# Patient Record
Sex: Female | Born: 1935 | Race: White | Hispanic: No | Marital: Single | Smoking: Former smoker
Health system: Southern US, Community
[De-identification: ages and names within clinical notes are randomized; demographics above are authoritative.]

## PROBLEM LIST (undated history)

## (undated) DIAGNOSIS — G309 Alzheimer's disease, unspecified: Secondary | ICD-10-CM

## (undated) DIAGNOSIS — D72819 Decreased white blood cell count, unspecified: Secondary | ICD-10-CM

## (undated) DIAGNOSIS — D1739 Benign lipomatous neoplasm of skin and subcutaneous tissue of other sites: Secondary | ICD-10-CM

## (undated) DIAGNOSIS — F0280 Dementia in other diseases classified elsewhere without behavioral disturbance: Secondary | ICD-10-CM

## (undated) DIAGNOSIS — H3530 Unspecified macular degeneration: Secondary | ICD-10-CM

## (undated) DIAGNOSIS — K5900 Constipation, unspecified: Secondary | ICD-10-CM

## (undated) DIAGNOSIS — D649 Anemia, unspecified: Secondary | ICD-10-CM

## (undated) DIAGNOSIS — M6789 Other specified disorders of synovium and tendon, multiple sites: Secondary | ICD-10-CM

## (undated) DIAGNOSIS — F329 Major depressive disorder, single episode, unspecified: Secondary | ICD-10-CM

## (undated) DIAGNOSIS — I270 Primary pulmonary hypertension: Secondary | ICD-10-CM

## (undated) DIAGNOSIS — I10 Essential (primary) hypertension: Secondary | ICD-10-CM

## (undated) DIAGNOSIS — J189 Pneumonia, unspecified organism: Secondary | ICD-10-CM

## (undated) DIAGNOSIS — H109 Unspecified conjunctivitis: Secondary | ICD-10-CM

## (undated) DIAGNOSIS — I4891 Unspecified atrial fibrillation: Secondary | ICD-10-CM

## (undated) DIAGNOSIS — I5030 Unspecified diastolic (congestive) heart failure: Secondary | ICD-10-CM

## (undated) DIAGNOSIS — H4310 Vitreous hemorrhage, unspecified eye: Secondary | ICD-10-CM

## (undated) DIAGNOSIS — R634 Abnormal weight loss: Secondary | ICD-10-CM

## (undated) DIAGNOSIS — H6120 Impacted cerumen, unspecified ear: Secondary | ICD-10-CM

## (undated) DIAGNOSIS — M6749 Ganglion, multiple sites: Secondary | ICD-10-CM

## (undated) DIAGNOSIS — R5381 Other malaise: Secondary | ICD-10-CM

## (undated) DIAGNOSIS — E538 Deficiency of other specified B group vitamins: Secondary | ICD-10-CM

## (undated) DIAGNOSIS — D696 Thrombocytopenia, unspecified: Secondary | ICD-10-CM

## (undated) DIAGNOSIS — E559 Vitamin D deficiency, unspecified: Secondary | ICD-10-CM

## (undated) DIAGNOSIS — M1990 Unspecified osteoarthritis, unspecified site: Secondary | ICD-10-CM

## (undated) DIAGNOSIS — M7139 Other bursal cyst, multiple sites: Secondary | ICD-10-CM

## (undated) HISTORY — DX: Other specified disorders of synovium and tendon, multiple sites: M67.89

## (undated) HISTORY — DX: Major depressive disorder, single episode, unspecified: F32.9

## (undated) HISTORY — DX: Other bursal cyst, multiple sites: M71.39

## (undated) HISTORY — DX: Unspecified osteoarthritis, unspecified site: M19.90

## (undated) HISTORY — DX: Other malaise: R53.81

## (undated) HISTORY — DX: Anemia, unspecified: D64.9

## (undated) HISTORY — DX: Unspecified macular degeneration: H35.30

## (undated) HISTORY — DX: Unspecified conjunctivitis: H10.9

## (undated) HISTORY — DX: Vitamin D deficiency, unspecified: E55.9

## (undated) HISTORY — DX: Vitreous hemorrhage, unspecified eye: H43.10

## (undated) HISTORY — DX: Ganglion, multiple sites: M67.49

## (undated) HISTORY — DX: Constipation, unspecified: K59.00

## (undated) HISTORY — DX: Alzheimer's disease, unspecified: G30.9

## (undated) HISTORY — DX: Dementia in other diseases classified elsewhere without behavioral disturbance: F02.80

## (undated) HISTORY — DX: Thrombocytopenia, unspecified: D69.6

## (undated) HISTORY — DX: Abnormal weight loss: R63.4

## (undated) HISTORY — DX: Unspecified diastolic (congestive) heart failure: I50.30

## (undated) HISTORY — DX: Essential (primary) hypertension: I10

## (undated) HISTORY — DX: Deficiency of other specified B group vitamins: E53.8

## (undated) HISTORY — DX: Impacted cerumen, unspecified ear: H61.20

## (undated) HISTORY — DX: Pneumonia, unspecified organism: J18.9

## (undated) HISTORY — DX: Decreased white blood cell count, unspecified: D72.819

## (undated) HISTORY — DX: Primary pulmonary hypertension: I27.0

## (undated) HISTORY — DX: Unspecified atrial fibrillation: I48.91

## (undated) HISTORY — PX: THYROIDECTOMY, PARTIAL: SHX18

## (undated) HISTORY — DX: Benign lipomatous neoplasm of skin and subcutaneous tissue of other sites: D17.39

---
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
## 1997-07-23 DIAGNOSIS — M1990 Unspecified osteoarthritis, unspecified site: Secondary | ICD-10-CM

## 1997-07-23 HISTORY — PX: TOTAL HIP ARTHROPLASTY: SHX124

## 1997-07-23 HISTORY — DX: Unspecified osteoarthritis, unspecified site: M19.90

## 1997-09-21 ENCOUNTER — Ambulatory Visit (HOSPITAL_COMMUNITY): Admission: RE | Admit: 1997-09-21 | Discharge: 1997-09-21 | Payer: Self-pay | Admitting: Obstetrics & Gynecology

## 1998-01-12 ENCOUNTER — Inpatient Hospital Stay (HOSPITAL_COMMUNITY): Admission: RE | Admit: 1998-01-12 | Discharge: 1998-01-19 | Payer: Self-pay | Admitting: *Deleted

## 1998-01-20 ENCOUNTER — Encounter (HOSPITAL_COMMUNITY): Admission: RE | Admit: 1998-01-20 | Discharge: 1998-04-20 | Payer: Self-pay | Admitting: Cardiology

## 1998-04-29 ENCOUNTER — Ambulatory Visit (HOSPITAL_COMMUNITY): Admission: RE | Admit: 1998-04-29 | Discharge: 1998-04-29 | Payer: Self-pay | Admitting: Cardiology

## 1998-05-06 ENCOUNTER — Ambulatory Visit (HOSPITAL_COMMUNITY): Admission: RE | Admit: 1998-05-06 | Discharge: 1998-05-06 | Payer: Self-pay | Admitting: Cardiology

## 1998-07-23 DIAGNOSIS — I10 Essential (primary) hypertension: Secondary | ICD-10-CM

## 1998-07-23 HISTORY — DX: Essential (primary) hypertension: I10

## 1998-11-01 ENCOUNTER — Ambulatory Visit (HOSPITAL_COMMUNITY): Admission: RE | Admit: 1998-11-01 | Discharge: 1998-11-01 | Payer: Self-pay | Admitting: Cardiology

## 1998-11-01 ENCOUNTER — Encounter: Payer: Self-pay | Admitting: Cardiology

## 1999-05-09 ENCOUNTER — Ambulatory Visit (HOSPITAL_COMMUNITY): Admission: RE | Admit: 1999-05-09 | Discharge: 1999-05-09 | Payer: Self-pay | Admitting: Cardiology

## 1999-05-09 ENCOUNTER — Encounter: Payer: Self-pay | Admitting: Cardiology

## 2000-05-14 ENCOUNTER — Encounter: Payer: Self-pay | Admitting: Internal Medicine

## 2000-05-14 ENCOUNTER — Ambulatory Visit (HOSPITAL_COMMUNITY): Admission: RE | Admit: 2000-05-14 | Discharge: 2000-05-14 | Payer: Self-pay | Admitting: Internal Medicine

## 2000-07-23 HISTORY — PX: CATARACT EXTRACTION W/ INTRAOCULAR LENS  IMPLANT, BILATERAL: SHX1307

## 2001-05-16 ENCOUNTER — Encounter: Payer: Self-pay | Admitting: Internal Medicine

## 2001-05-16 ENCOUNTER — Ambulatory Visit (HOSPITAL_COMMUNITY): Admission: RE | Admit: 2001-05-16 | Discharge: 2001-05-16 | Payer: Self-pay | Admitting: Internal Medicine

## 2002-05-19 ENCOUNTER — Encounter: Payer: Self-pay | Admitting: Internal Medicine

## 2002-05-19 ENCOUNTER — Ambulatory Visit (HOSPITAL_COMMUNITY): Admission: RE | Admit: 2002-05-19 | Discharge: 2002-05-19 | Payer: Self-pay | Admitting: Internal Medicine

## 2002-10-12 ENCOUNTER — Emergency Department (HOSPITAL_COMMUNITY): Admission: EM | Admit: 2002-10-12 | Discharge: 2002-10-12 | Payer: Self-pay | Admitting: Emergency Medicine

## 2002-10-12 ENCOUNTER — Encounter: Payer: Self-pay | Admitting: Emergency Medicine

## 2003-05-21 ENCOUNTER — Ambulatory Visit (HOSPITAL_COMMUNITY): Admission: RE | Admit: 2003-05-21 | Discharge: 2003-05-21 | Payer: Self-pay | Admitting: Internal Medicine

## 2003-07-24 DIAGNOSIS — H3530 Unspecified macular degeneration: Secondary | ICD-10-CM

## 2003-07-24 HISTORY — PX: VITRECTOMY: SHX106

## 2003-07-24 HISTORY — DX: Unspecified macular degeneration: H35.30

## 2003-11-16 ENCOUNTER — Ambulatory Visit (HOSPITAL_COMMUNITY): Admission: RE | Admit: 2003-11-16 | Discharge: 2003-11-16 | Payer: Self-pay | Admitting: Internal Medicine

## 2003-11-16 ENCOUNTER — Encounter (INDEPENDENT_AMBULATORY_CARE_PROVIDER_SITE_OTHER): Payer: Self-pay | Admitting: Specialist

## 2004-05-15 ENCOUNTER — Ambulatory Visit (HOSPITAL_COMMUNITY): Admission: RE | Admit: 2004-05-15 | Discharge: 2004-05-16 | Payer: Self-pay | Admitting: Ophthalmology

## 2004-05-23 ENCOUNTER — Ambulatory Visit (HOSPITAL_COMMUNITY): Admission: RE | Admit: 2004-05-23 | Discharge: 2004-05-23 | Payer: Self-pay | Admitting: Internal Medicine

## 2005-03-12 ENCOUNTER — Ambulatory Visit (HOSPITAL_COMMUNITY): Admission: RE | Admit: 2005-03-12 | Discharge: 2005-03-12 | Payer: Self-pay | Admitting: Ophthalmology

## 2005-05-24 ENCOUNTER — Ambulatory Visit (HOSPITAL_COMMUNITY): Admission: RE | Admit: 2005-05-24 | Discharge: 2005-05-24 | Payer: Self-pay | Admitting: Internal Medicine

## 2006-06-26 ENCOUNTER — Encounter: Admission: RE | Admit: 2006-06-26 | Discharge: 2006-06-26 | Payer: Self-pay | Admitting: Internal Medicine

## 2006-07-23 DIAGNOSIS — I4891 Unspecified atrial fibrillation: Secondary | ICD-10-CM

## 2006-07-23 HISTORY — DX: Unspecified atrial fibrillation: I48.91

## 2007-07-02 ENCOUNTER — Encounter: Admission: RE | Admit: 2007-07-02 | Discharge: 2007-07-02 | Payer: Self-pay | Admitting: Internal Medicine

## 2007-07-24 DIAGNOSIS — E559 Vitamin D deficiency, unspecified: Secondary | ICD-10-CM

## 2007-07-24 HISTORY — DX: Vitamin D deficiency, unspecified: E55.9

## 2007-10-30 ENCOUNTER — Emergency Department (HOSPITAL_COMMUNITY): Admission: EM | Admit: 2007-10-30 | Discharge: 2007-10-30 | Payer: Self-pay | Admitting: Emergency Medicine

## 2007-11-17 ENCOUNTER — Ambulatory Visit: Payer: Self-pay | Admitting: Cardiovascular Disease

## 2007-11-17 LAB — CONVERTED CEMR LAB
BUN: 36 mg/dL — ABNORMAL HIGH (ref 6–23)
CO2: 31 meq/L (ref 19–32)
Calcium: 9.1 mg/dL (ref 8.4–10.5)
Chloride: 105 meq/L (ref 96–112)
Creatinine, Ser: 1.2 mg/dL (ref 0.4–1.2)
GFR calc Af Amer: 55 mL/min
GFR calc non Af Amer: 46 mL/min
Glucose, Bld: 145 mg/dL — ABNORMAL HIGH (ref 70–99)
Potassium: 4.4 meq/L (ref 3.5–5.1)
Sodium: 140 meq/L (ref 135–145)
TSH: 1.91 microintl units/mL (ref 0.35–5.50)

## 2007-11-26 ENCOUNTER — Ambulatory Visit: Payer: Self-pay

## 2007-11-26 ENCOUNTER — Ambulatory Visit: Payer: Self-pay | Admitting: Internal Medicine

## 2007-11-26 ENCOUNTER — Encounter: Payer: Self-pay | Admitting: Internal Medicine

## 2007-11-26 ENCOUNTER — Ambulatory Visit: Payer: Self-pay | Admitting: Cardiovascular Disease

## 2007-12-23 ENCOUNTER — Ambulatory Visit: Payer: Self-pay | Admitting: Internal Medicine

## 2008-03-15 ENCOUNTER — Ambulatory Visit: Payer: Self-pay | Admitting: Internal Medicine

## 2008-03-24 ENCOUNTER — Ambulatory Visit: Payer: Self-pay | Admitting: Internal Medicine

## 2008-04-20 ENCOUNTER — Ambulatory Visit: Payer: Self-pay | Admitting: Internal Medicine

## 2008-07-02 ENCOUNTER — Encounter: Admission: RE | Admit: 2008-07-02 | Discharge: 2008-07-02 | Payer: Self-pay | Admitting: Internal Medicine

## 2008-09-29 ENCOUNTER — Ambulatory Visit: Payer: Self-pay | Admitting: Internal Medicine

## 2009-07-23 DIAGNOSIS — I270 Primary pulmonary hypertension: Secondary | ICD-10-CM

## 2009-07-23 DIAGNOSIS — F3289 Other specified depressive episodes: Secondary | ICD-10-CM

## 2009-07-23 DIAGNOSIS — D1739 Benign lipomatous neoplasm of skin and subcutaneous tissue of other sites: Secondary | ICD-10-CM

## 2009-07-23 DIAGNOSIS — I5030 Unspecified diastolic (congestive) heart failure: Secondary | ICD-10-CM

## 2009-07-23 DIAGNOSIS — H109 Unspecified conjunctivitis: Secondary | ICD-10-CM

## 2009-07-23 DIAGNOSIS — F329 Major depressive disorder, single episode, unspecified: Secondary | ICD-10-CM

## 2009-07-23 DIAGNOSIS — H4310 Vitreous hemorrhage, unspecified eye: Secondary | ICD-10-CM

## 2009-07-23 DIAGNOSIS — R5381 Other malaise: Secondary | ICD-10-CM

## 2009-07-23 DIAGNOSIS — D649 Anemia, unspecified: Secondary | ICD-10-CM

## 2009-07-23 DIAGNOSIS — J189 Pneumonia, unspecified organism: Secondary | ICD-10-CM

## 2009-07-23 DIAGNOSIS — H6120 Impacted cerumen, unspecified ear: Secondary | ICD-10-CM

## 2009-07-23 DIAGNOSIS — D696 Thrombocytopenia, unspecified: Secondary | ICD-10-CM

## 2009-07-23 DIAGNOSIS — D72819 Decreased white blood cell count, unspecified: Secondary | ICD-10-CM

## 2009-07-23 HISTORY — DX: Anemia, unspecified: D64.9

## 2009-07-23 HISTORY — DX: Major depressive disorder, single episode, unspecified: F32.9

## 2009-07-23 HISTORY — DX: Pneumonia, unspecified organism: J18.9

## 2009-07-23 HISTORY — DX: Unspecified conjunctivitis: H10.9

## 2009-07-23 HISTORY — DX: Vitreous hemorrhage, unspecified eye: H43.10

## 2009-07-23 HISTORY — DX: Benign lipomatous neoplasm of skin and subcutaneous tissue of other sites: D17.39

## 2009-07-23 HISTORY — DX: Other malaise: R53.81

## 2009-07-23 HISTORY — DX: Unspecified diastolic (congestive) heart failure: I50.30

## 2009-07-23 HISTORY — DX: Other specified depressive episodes: F32.89

## 2009-07-23 HISTORY — DX: Primary pulmonary hypertension: I27.0

## 2009-07-23 HISTORY — DX: Thrombocytopenia, unspecified: D69.6

## 2009-07-23 HISTORY — DX: Impacted cerumen, unspecified ear: H61.20

## 2009-07-23 HISTORY — DX: Decreased white blood cell count, unspecified: D72.819

## 2009-08-23 HISTORY — PX: VITRECTOMY: SHX106

## 2009-08-29 ENCOUNTER — Ambulatory Visit (HOSPITAL_COMMUNITY): Admission: RE | Admit: 2009-08-29 | Discharge: 2009-08-29 | Payer: Self-pay | Admitting: Ophthalmology

## 2009-10-21 ENCOUNTER — Encounter: Admission: RE | Admit: 2009-10-21 | Discharge: 2009-10-21 | Payer: Self-pay | Admitting: Internal Medicine

## 2009-11-02 ENCOUNTER — Inpatient Hospital Stay (HOSPITAL_COMMUNITY): Admission: EM | Admit: 2009-11-02 | Discharge: 2009-11-07 | Payer: Self-pay | Admitting: Emergency Medicine

## 2009-11-02 ENCOUNTER — Ambulatory Visit: Payer: Self-pay | Admitting: Cardiology

## 2009-11-03 ENCOUNTER — Encounter (INDEPENDENT_AMBULATORY_CARE_PROVIDER_SITE_OTHER): Payer: Self-pay | Admitting: Internal Medicine

## 2009-11-20 ENCOUNTER — Ambulatory Visit: Payer: Self-pay | Admitting: Internal Medicine

## 2009-11-20 ENCOUNTER — Inpatient Hospital Stay (HOSPITAL_COMMUNITY): Admission: EM | Admit: 2009-11-20 | Discharge: 2009-11-28 | Payer: Self-pay | Admitting: Emergency Medicine

## 2009-11-24 ENCOUNTER — Ambulatory Visit: Payer: Self-pay | Admitting: Emergency Medicine

## 2009-11-28 ENCOUNTER — Encounter: Payer: Self-pay | Admitting: Physician Assistant

## 2009-12-12 ENCOUNTER — Encounter: Payer: Self-pay | Admitting: Physician Assistant

## 2009-12-13 ENCOUNTER — Encounter: Payer: Self-pay | Admitting: Physician Assistant

## 2009-12-13 DIAGNOSIS — I519 Heart disease, unspecified: Secondary | ICD-10-CM

## 2009-12-13 DIAGNOSIS — D649 Anemia, unspecified: Secondary | ICD-10-CM

## 2009-12-13 DIAGNOSIS — I4891 Unspecified atrial fibrillation: Secondary | ICD-10-CM

## 2009-12-13 DIAGNOSIS — I10 Essential (primary) hypertension: Secondary | ICD-10-CM | POA: Insufficient documentation

## 2009-12-13 DIAGNOSIS — H3530 Unspecified macular degeneration: Secondary | ICD-10-CM

## 2009-12-13 DIAGNOSIS — M1990 Unspecified osteoarthritis, unspecified site: Secondary | ICD-10-CM | POA: Insufficient documentation

## 2009-12-13 HISTORY — DX: Anemia, unspecified: D64.9

## 2009-12-20 ENCOUNTER — Encounter: Payer: Self-pay | Admitting: Physician Assistant

## 2009-12-20 ENCOUNTER — Encounter (INDEPENDENT_AMBULATORY_CARE_PROVIDER_SITE_OTHER): Payer: Self-pay | Admitting: *Deleted

## 2009-12-20 ENCOUNTER — Encounter: Payer: Self-pay | Admitting: Internal Medicine

## 2009-12-20 ENCOUNTER — Ambulatory Visit: Payer: Self-pay | Admitting: Internal Medicine

## 2009-12-20 ENCOUNTER — Telehealth: Payer: Self-pay | Admitting: Physician Assistant

## 2010-06-13 IMAGING — CT CT ABDOMEN W/ CM
2 of 5 series · 11 of 36 positions shown, 18 images · IV contrast (READICAT/WATER & [ID] OMNI 300)
Comparison: Chest x-ray of 08/29/2009.

CT CHEST

CLINICAL DATA: Anemia, weight loss, fatigue, abnormal liver
function tests, former smoker with cough and shortness of breath

CT CHEST AND ABDOMEN WITH CONTRAST
TECHNIQUE: Multidetector CT imaging of the chest and abdomen was
performed following the standard protocol during bolus
administration of intravenous contrast.
Contrast: 100 ml [id number]-X66

[Series 601: coronal body · coronal · 0.92mm/px · 1 of 126 slices shown, 2 images]
[im 42/126  soft-tissue]
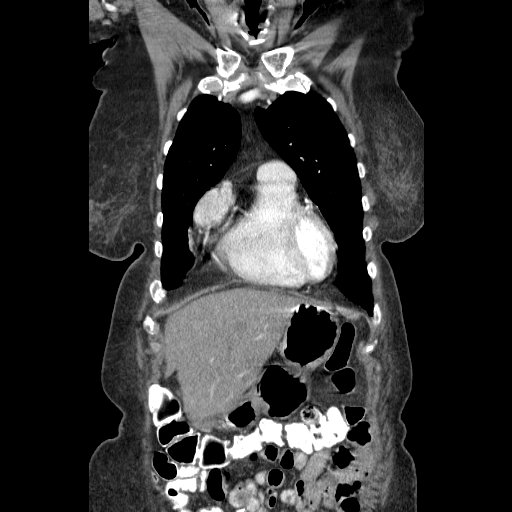
[im 42/126  bone]
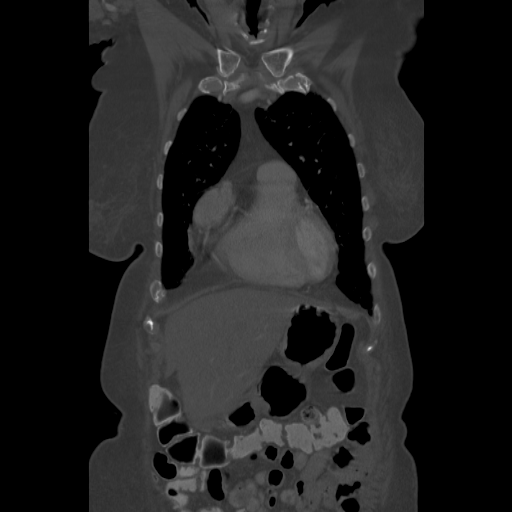

[Series 602: sagittal body · sagittal · 0.92mm/px · 10 of 137 slices shown, 16 images]
[im 12/137  soft-tissue]
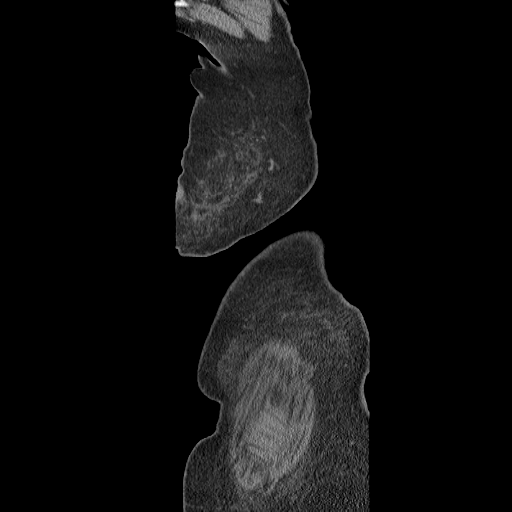
[im 12/137  lung]
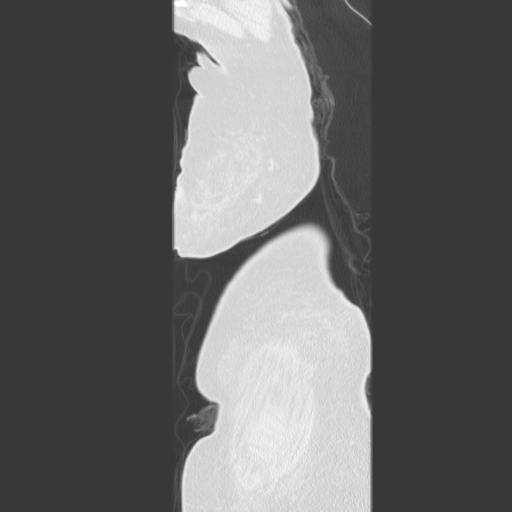
[im 12/137  bone]
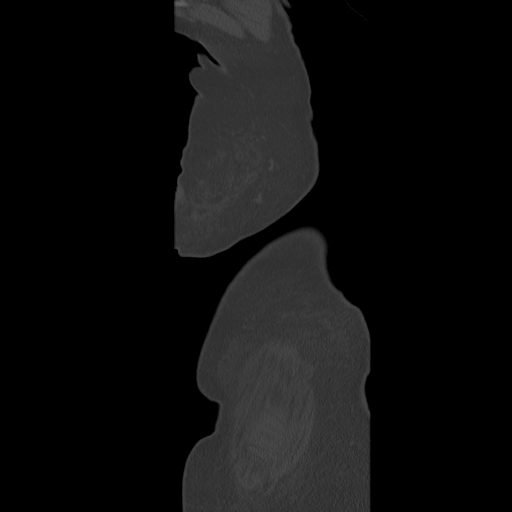
[im 23/137  soft-tissue]
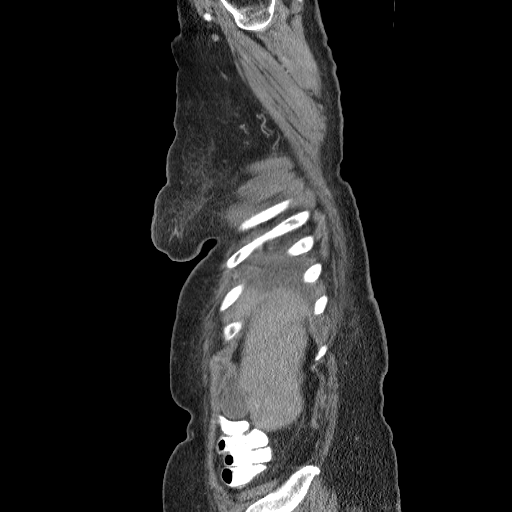
[im 23/137  lung]
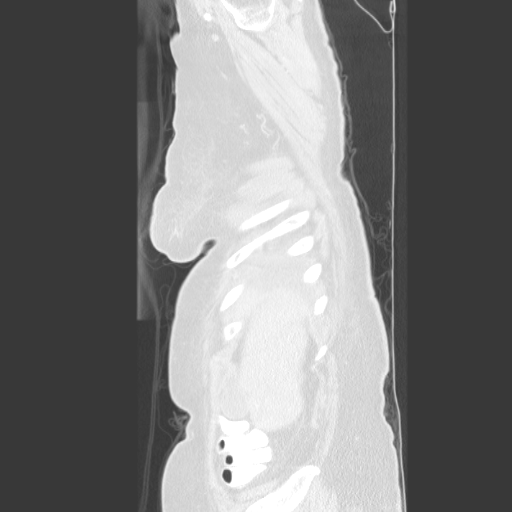
[im 35/137  soft-tissue]
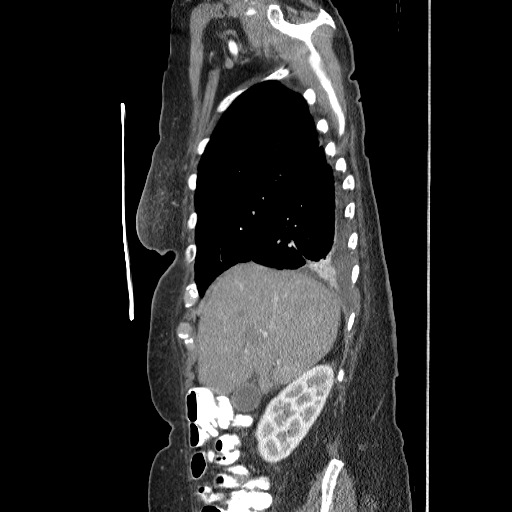
[im 35/137  lung]
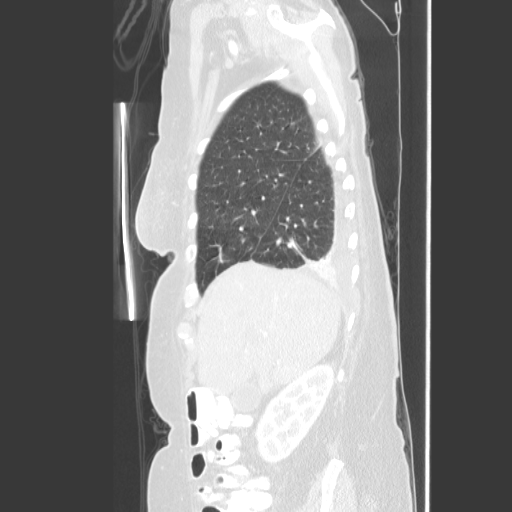
[im 46/137  soft-tissue]
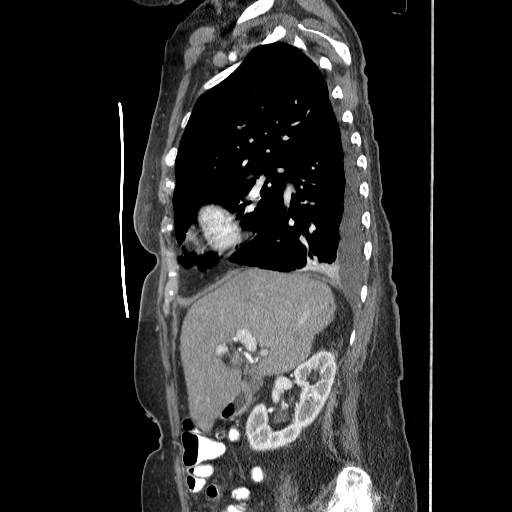
[im 46/137  lung]
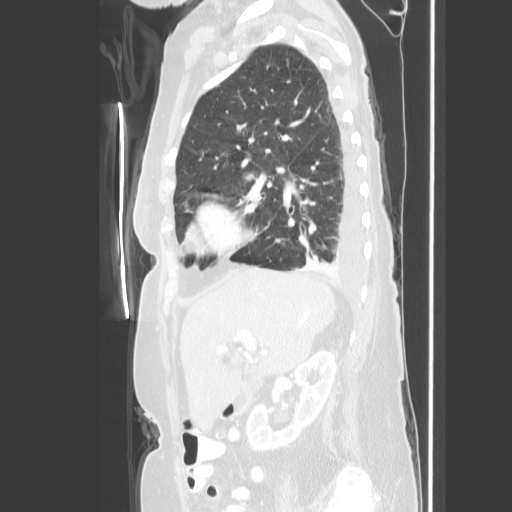
[im 57/137  soft-tissue]
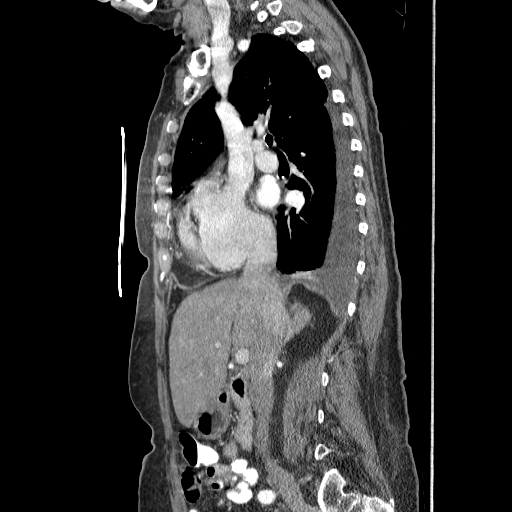
[im 80/137  soft-tissue]
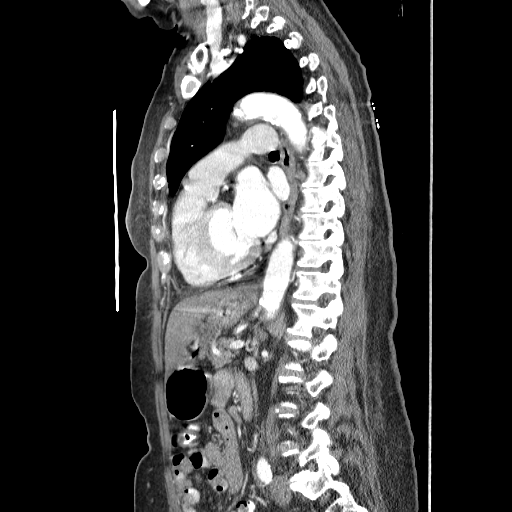
[im 91/137  soft-tissue]
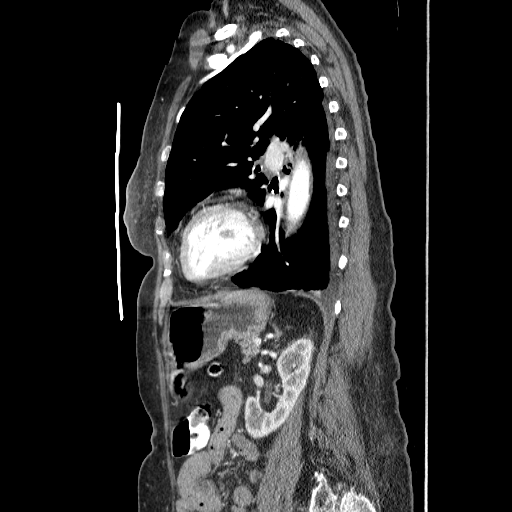
[im 103/137  soft-tissue]
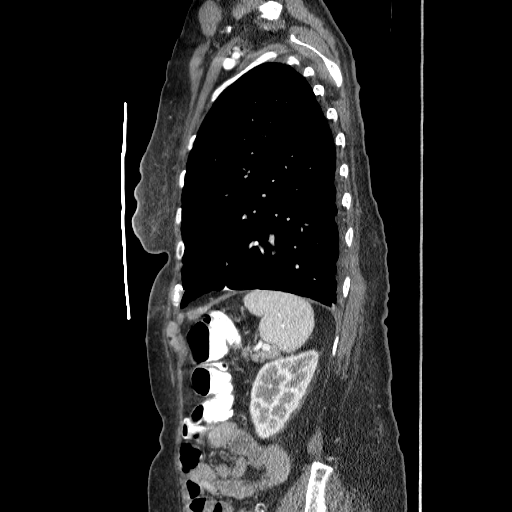
[im 114/137  soft-tissue]
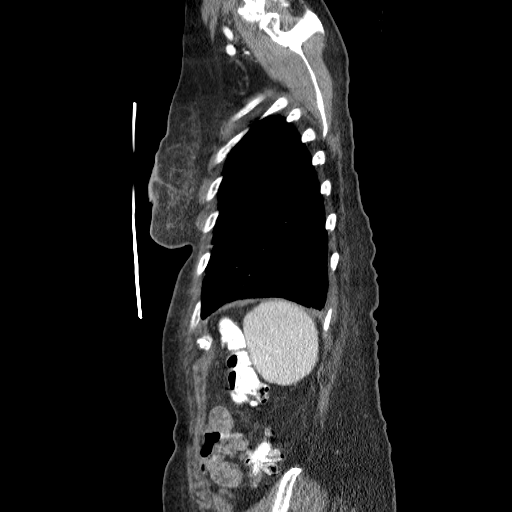
[im 114/137  bone]
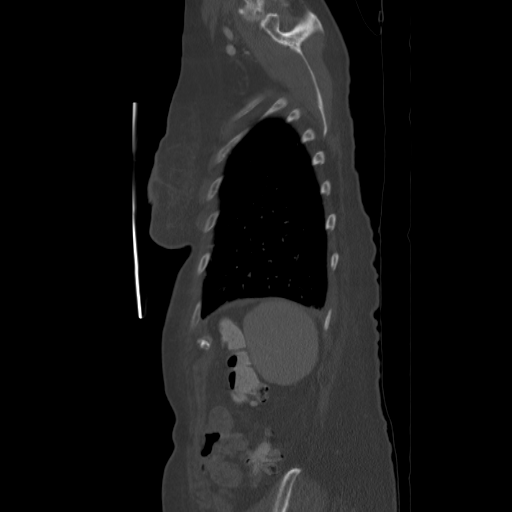
[im 125/137  soft-tissue]
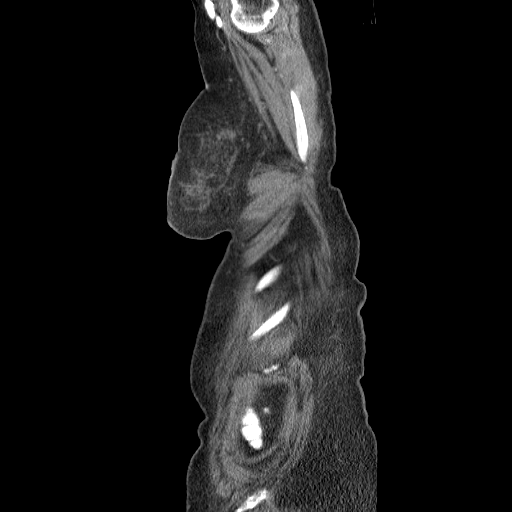

[11 of 36 positions shown; findings below may reference images not displayed]

FINDINGS: There are small bilateral pleural effusions present.
Minimally prominent interstitial markings extending to the pleura
may indicate mild interstitial edema.  There is some volume loss of
the right lower lobe posteriorly.  No lung infiltrate or nodule is
seen.  There is a single precarinal node of 12 mm in short axis
diameter.  No definite mediastinal or hilar adenopathy is seen.
There is cardiomegaly present but no pericardial effusion is noted.
The thoracic aorta is normal in caliber and the origins of the
great vessels are calcified but patent.  No bony abnormality is
seen.
IMPRESSION: 1.  Small bilateral pleural effusions.
2.  Slightly prominent interstitial markings with cardiomegaly.
Question mild interstitial edema.

CT ABDOMEN
FINDINGS: The liver is slightly low in attenuation which may
indicate fatty infiltration.  No ductal dilatation is seen and no
focal abnormality is noted.  The portal vein is intact.  No
definite calcified gallstones are seen.   There appears be a small
descending duodenal diverticulum slightly compressing the adjacent
common bile duct.  The pancreas is normal in size and the
pancreatic duct is not dilated.  The adrenal glands and spleen are
unremarkable.  The kidneys enhance with no calculus or mass and on
delayed images the pelvocaliceal systems appear normal.  The
abdominal aorta is normal in caliber with diffuse diffuse
atheromatous change.  The origins of the celiac axis and SMA are
patent as are the renal arteries and IMA.  No bony abnormality is
seen.
IMPRESSION: 1.  Slightly inhomogeneous liver with low attenuation suggesting
fatty infiltration.  No focal abnormality.  No ductal dilatation.
2.  Diffuse atheromatous change throughout the abdominal aorta.

## 2010-07-23 DIAGNOSIS — M6749 Ganglion, multiple sites: Secondary | ICD-10-CM

## 2010-07-23 DIAGNOSIS — E538 Deficiency of other specified B group vitamins: Secondary | ICD-10-CM

## 2010-07-23 HISTORY — DX: Ganglion, multiple sites: M67.49

## 2010-07-23 HISTORY — DX: Deficiency of other specified B group vitamins: E53.8

## 2010-08-22 NOTE — Progress Notes (Signed)
Summary: Continuing Care Medication Administration Record   Continuing Care Medication Administration Record   Imported By: Roderic Ovens 12/26/2009 14:09:50  _____________________________________________________________________  External Attachment:    Type:   Image     Comment:   External Document

## 2010-08-22 NOTE — Progress Notes (Signed)
Summary: med question   Phone Note From Other Clinic Call back at [phone]   Caller: Susan/[facility] Summary of Call: a has question about meds Initial call taken by: Migdalia Dk,  Dec 20, 2009 2:35 PM  Follow-up for Phone Call        talked with Darl Pikes at [facility] not taking HCTZ as was noted on her medication list today--pt had not been on this is quite some time--pt was D/C from hospital May6,2011 on Lasix and KCL that was stopped by Dr Chilton Si at [facility] on May 10,2011 due to low B/P and low HGB per Susan--I have removed HCTZ from our medication list --Darl Pikes states that Dr Chilton Si will monitor fluid status and B/P and make a decision about Lasix-

## 2010-08-22 NOTE — Assessment & Plan Note (Signed)
Summary: eph/ gd    Visit Type:  Follow-up  CC:  no complaints.  History of Present Illness: This is an 77 year old white female patient with dementia who is here from [facility] after being hospitalized with acute on chronic diastolic heart failure and rapid atrial fibrillation with tachybradycardia syndrome. She also had pneumonia and was treated with fluids and antibiotics at which point she went into heart failure. 2-D echo in April 2011 showed an ejection fraction of 55-60% with mild aortic stenosis and mild mitral regurgitation. There is also mild pulmonary hypertension.  The notes from [facility] indicate that she became hypotensive and they stopped her Lasix and potassium. She still takes hydrochlorothiazide. The notes also state they stopped her Coumadin because of a hemoglobin of 8.8 on May 24. She has a DNR and no GI workup is indicated at this time according to her power of attorney. I did speak with the nurse at [facility] and she stated she had a BNP May 17 that was 1043 and May 24 that was 1287.  The patient does have Alzheimer's but denies complaints she denies chest pain palpitations dyspnea dyspnea on exertion dizziness or presyncope. She is checked regularly and by Dr. Chilton Si and the nurse practitioner.  Current Medications (verified): 1)  Hydrochlorothiazide 12.5 Mg Caps (Hydrochlorothiazide) .... Take One Capsule By Mouth Once Daily 2)  Lisinopril 10 Mg Tabs (Lisinopril) .... Take One Daily 3)  Coumadin 1 Mg Tabs (Warfarin Sodium) .... Take As Directed 4)  Cardizem Cd 120 Mg Xr24h-Cap (Diltiazem Hcl Coated Beads) .... Take One Daily 5)  Exelon 9.5 Mg/24hr Pt24 (Rivastigmine) .Marland Kitchen.. 1 Patch To Skin Daily 6)  Namenda 10 Mg Tabs (Memantine Hcl) .... Take One Two Times A Day 7)  Nevanac 0.1 % Susp (Nepafenac) .... One Drop Left Eye Three Times A Day 8)  Vitamin D3 2000 Unit Tabs (Cholecalciferol) .... Take  2 Tablets Daily 9)  Xopenex 0.63 Mg/69ml Nebu  (Levalbuterol Hcl) .Marland Kitchen.. 1 Puff Every 6 Hour While Awake 10)  Zoloft 25 Mg Tabs (Sertraline Hcl) .... Take One Nightly  Past History:  Past Medical History: Last updated: 12/13/2009  1. Hypertension.   2. Paroxysmal atrial fibrillation with history of tachy-brady       syndrome.   3. Diastolic dysfunction.       a.     An echocardiogram in April 2011 showed an EF of 55 to 60%        with mild aortic stenosis and mild mitral regurgitation.  There is        also mild pulmonary hypertension.   4. Dementia.   5. Anemia.   6. Macular degeneration.   7. Osteoarthritis.   8. History of thrombocytopenia.   9.      The patient is DNR/DNI.   Review of Systems       see history of present illness  Vital Signs:  Patient profile:   77 year old female Height:      68 inches Weight:      171 pounds BMI:     26.09 Pulse rate:   56 / minute BP sitting:   100 / 44  (right arm)  Vitals Entered By: Jacquelin Hawking, CMA (Dec 20, 2009 12:42 PM)  Physical Exam  General:   Well-nournished, in no acute distress. Neck: No JVD, HJR, Bruit, or thyroid enlargement Lungs: No tachypnea, clear without wheezing, rales, or rhonchi Cardiovascular: RRR, PMI not displaced, heart sounds normal,1-2/6 systolic murmur at the left  sternal border, no gallops, bruit, thrill, or heave. Abdomen: BS normal. Soft without organomegaly, masses, lesions or tenderness. Extremities: trace of edema bilaterally in the lower extremities she does have TED hose on,without cyanosis, clubbing. Good distal pulses bilateral SKin: Warm, no lesions or rashes  Musculoskeletal: No deformities Neuro: no focal signs    EKG  Procedure date:  12/20/2009  Findings:      atrial fibrillation at 72 beats per minute  Impression & Recommendations:  Problem # 1:  DIASTOLIC DYSFUNCTION (ICD-429.9) Patient had diastolic dysfunction while receiving IV fluids in the hospital and diuresed easily. Her Lasix was stopped at the nursing home  because of hypotension. She is also anemic with a hemoglobin of 8.8 and her Coumadin was stopped. She is a DNR.Her BNP was elevated on May 24 at 1287. She is asymptomatic and I don't see much evidence of heart failure on exam today. She appears to be compensated as well as remaining hypotensive. She may need Lasix in the near future and I relayed this to the nurse at [facility].  Problem # 2:  ATRIAL FIBRILLATION, PAROXYSMAL (ICD-427.31) Patient remains in nature fibrillation with a controlled ventricular rate. Her Coumadin was stopped because of a hemoglobin of 8.8 and no desire to proceed with workup per power of attorney. The following medications were removed from the medication list:    Coumadin 1 Mg Tabs (Warfarin sodium) .Marland Kitchen... Take as directed  Orders: EKG w/ Interpretation (93000)  Problem # 3:  HYPERTENSION (ICD-401.9) patient is currently hypotensive and diuretics are on hold. The following medications were removed from the medication list:    Furosemide 20 Mg Tabs (Furosemide) .Marland Kitchen... Take one daily Her updated medication list for this problem includes:    Hydrochlorothiazide 12.5 Mg Caps (Hydrochlorothiazide) .Marland Kitchen... Take one capsule by mouth once daily    Lisinopril 10 Mg Tabs (Lisinopril) .Marland Kitchen... Take one daily    Cardizem Cd 120 Mg Xr24h-cap (Diltiazem hcl coated beads) .Marland Kitchen... Take one daily  Patient Instructions: 1)  Your physician recommends that you schedule a follow-up appointment in: 3 months with Dr. Gala Romney 2)  Your physician has requested that you limit the intake of sodium (salt) in your diet to two grams daily. Please see [facility] handout.

## 2010-08-22 NOTE — Letter (Signed)
Summary: [facility] Telephone Orders   [facility] Telephone Orders   Imported By: Roderic Ovens 01/19/2010 16:05:39  _____________________________________________________________________  External Attachment:    Type:   Image     Comment:   External Document

## 2010-08-22 NOTE — Miscellaneous (Signed)
  Clinical Lists Changes  Medications: Added new medication of LISINOPRIL 10 MG TABS (LISINOPRIL) take one daily Added new medication of COUMADIN 1 MG TABS (WARFARIN SODIUM) take as directed Added new medication of CARDIZEM CD 120 MG XR24H-CAP (DILTIAZEM HCL COATED BEADS) take one daily Added new medication of EXELON 9.5 MG/24HR PT24 (RIVASTIGMINE) 1 patch to skin daily Added new medication of POTASSIUM CHLORIDE 20 MEQ PACK (POTASSIUM CHLORIDE) take one daily Added new medication of FUROSEMIDE 20 MG TABS (FUROSEMIDE) take one daily Added new medication of NAMENDA 10 MG TABS (MEMANTINE HCL) take one two times a day Added new medication of NEVANAC 0.1 % SUSP (NEPAFENAC) one drop left eye three times a day Added new medication of VITAMIN D3 2000 UNIT TABS (CHOLECALCIFEROL) take  2 tablets daily Added new medication of XOPENEX 0.63 MG/3ML NEBU (LEVALBUTEROL HCL) 1 puff every 6 hour while awake Added new medication of ZOLOFT 25 MG TABS (SERTRALINE HCL) take one nightly

## 2010-08-22 NOTE — Letter (Signed)
Summary: [facility] Progress Note  [facility] Progress Note   Imported By: Roderic Ovens 12/26/2009 13:52:45  _____________________________________________________________________  External Attachment:    Type:   Image     Comment:   External Document

## 2010-10-10 LAB — BASIC METABOLIC PANEL
BUN: 19 mg/dL (ref 6–23)
BUN: 26 mg/dL — ABNORMAL HIGH (ref 6–23)
BUN: 28 mg/dL — ABNORMAL HIGH (ref 6–23)
BUN: 29 mg/dL — ABNORMAL HIGH (ref 6–23)
CO2: 26 mEq/L (ref 19–32)
CO2: 29 mEq/L (ref 19–32)
CO2: 36 mEq/L — ABNORMAL HIGH (ref 19–32)
CO2: 39 mEq/L — ABNORMAL HIGH (ref 19–32)
Calcium: 7.5 mg/dL — ABNORMAL LOW (ref 8.4–10.5)
Calcium: 8.3 mg/dL — ABNORMAL LOW (ref 8.4–10.5)
Calcium: 8.4 mg/dL (ref 8.4–10.5)
Calcium: 8.5 mg/dL (ref 8.4–10.5)
Calcium: 8.9 mg/dL (ref 8.4–10.5)
Calcium: 8.2 mg/dL — ABNORMAL LOW (ref 8.4–10.5)
Chloride: 102 mEq/L (ref 96–112)
Chloride: 105 mEq/L (ref 96–112)
Creatinine, Ser: 0.66 mg/dL (ref 0.4–1.2)
Creatinine, Ser: 0.68 mg/dL (ref 0.4–1.2)
Creatinine, Ser: 0.76 mg/dL (ref 0.4–1.2)
Creatinine, Ser: 0.76 mg/dL (ref 0.4–1.2)
Creatinine, Ser: 0.77 mg/dL (ref 0.4–1.2)
Creatinine, Ser: 0.78 mg/dL (ref 0.4–1.2)
GFR calc Af Amer: >60 mL/min (ref >60)
GFR calc Af Amer: >60 mL/min (ref >60)
GFR calc Af Amer: >60 mL/min (ref >60)
GFR calc Af Amer: >60 mL/min (ref >60)
GFR calc Af Amer: >60 mL/min (ref >60)
GFR calc non Af Amer: >60 mL/min (ref >60)
GFR calc non Af Amer: >60 mL/min (ref >60)
GFR calc non Af Amer: >60 mL/min (ref >60)
GFR calc non Af Amer: >60 mL/min (ref >60)
GFR calc non Af Amer: >60 mL/min (ref >60)
Glucose, Bld: 171 mg/dL — ABNORMAL HIGH (ref 70–99)
Glucose, Bld: 187 mg/dL — ABNORMAL HIGH (ref 70–99)
Glucose, Bld: 215 mg/dL — ABNORMAL HIGH (ref 70–99)
Potassium: 4.4 mEq/L (ref 3.5–5.1)
Potassium: 4.8 mEq/L (ref 3.5–5.1)
Potassium: 4.9 mEq/L (ref 3.5–5.1)
Sodium: 136 mEq/L (ref 135–145)
Sodium: 138 mEq/L (ref 135–145)
Sodium: 141 mEq/L (ref 135–145)
Sodium: 140 mEq/L (ref 135–145)

## 2010-10-10 LAB — DIFFERENTIAL
Basophils Absolute: 0 10*3/uL (ref 0.0–0.1)
Basophils Absolute: 0 10*3/uL (ref 0.0–0.1)
Basophils Relative: 0 % (ref 0–1)
Basophils Relative: 0 % (ref 0–1)
Eosinophils Absolute: 0.2 10*3/uL (ref 0.0–0.7)
Eosinophils Absolute: 0.2 10*3/uL (ref 0.0–0.7)
Eosinophils Relative: 2 % (ref 0–5)
Eosinophils Relative: 2 % (ref 0–5)
Lymphocytes Relative: 5 % — ABNORMAL LOW (ref 12–46)
Lymphocytes Relative: 9 % — ABNORMAL LOW (ref 12–46)
Lymphs Abs: 0.2 10*3/uL — ABNORMAL LOW (ref 0.7–4.0)
Lymphs Abs: 0.6 10*3/uL — ABNORMAL LOW (ref 0.7–4.0)
Monocytes Absolute: 0.1 10*3/uL (ref 0.1–1.0)
Monocytes Absolute: 0.8 10*3/uL (ref 0.1–1.0)
Monocytes Relative: 2 % — ABNORMAL LOW (ref 3–12)
Monocytes Relative: 7 % (ref 3–12)
Neutro Abs: 4.9 10*3/uL (ref 1.7–7.7)
Neutro Abs: 5.9 10*3/uL (ref 1.7–7.7)
Neutrophils Relative %: 88 % — ABNORMAL HIGH (ref 43–77)
Neutrophils Relative %: 93 % — ABNORMAL HIGH (ref 43–77)

## 2010-10-10 LAB — PROTIME-INR
INR: 1.59 — ABNORMAL HIGH (ref 0.00–1.49)
INR: 1.59 — ABNORMAL HIGH (ref 0.00–1.49)
INR: 2.36 — ABNORMAL HIGH (ref 0.00–1.49)
INR: 2.85 — ABNORMAL HIGH (ref 0.00–1.49)
INR: 3.34 — ABNORMAL HIGH (ref 0.00–1.49)
INR: 2.81 — ABNORMAL HIGH (ref 0.00–1.49)
Prothrombin Time: 18.8 seconds — ABNORMAL HIGH (ref 11.6–15.2)
Prothrombin Time: 18.8 seconds — ABNORMAL HIGH (ref 11.6–15.2)
Prothrombin Time: 19.4 seconds — ABNORMAL HIGH (ref 11.6–15.2)
Prothrombin Time: 25.5 seconds — ABNORMAL HIGH (ref 11.6–15.2)
Prothrombin Time: 25.6 seconds — ABNORMAL HIGH (ref 11.6–15.2)
Prothrombin Time: 33.6 seconds — ABNORMAL HIGH (ref 11.6–15.2)
Prothrombin Time: 26.8 seconds — ABNORMAL HIGH (ref 11.6–15.2)

## 2010-10-10 LAB — CULTURE, BLOOD (ROUTINE X 2)
Culture: NO GROWTH
Culture: NO GROWTH

## 2010-10-10 LAB — URINALYSIS, MICROSCOPIC ONLY
Nitrite: NEGATIVE
Protein, ur: NEGATIVE mg/dL
Specific Gravity, Urine: 1.021 (ref 1.005–1.030)
Urobilinogen, UA: 1 mg/dL (ref 0.0–1.0)

## 2010-10-10 LAB — CBC
HCT: 29.1 % — ABNORMAL LOW (ref 36.0–46.0)
HCT: 29.2 % — ABNORMAL LOW (ref 36.0–46.0)
HCT: 32.3 % — ABNORMAL LOW (ref 36.0–46.0)
Hemoglobin: 10.1 g/dL — ABNORMAL LOW (ref 12.0–15.0)
Hemoglobin: 11.1 g/dL — ABNORMAL LOW (ref 12.0–15.0)
Hemoglobin: 11.3 g/dL — ABNORMAL LOW (ref 12.0–15.0)
Hemoglobin: 11.3 g/dL — ABNORMAL LOW (ref 12.0–15.0)
MCHC: 34.3 g/dL (ref 30.0–36.0)
MCHC: 34.5 g/dL (ref 30.0–36.0)
MCHC: 34.7 g/dL (ref 30.0–36.0)
MCHC: 35 g/dL (ref 30.0–36.0)
MCV: 100.5 fL — ABNORMAL HIGH (ref 78.0–100.0)
MCV: 101 fL — ABNORMAL HIGH (ref 78.0–100.0)
MCV: 101.3 fL — ABNORMAL HIGH (ref 78.0–100.0)
MCV: 102 fL — ABNORMAL HIGH (ref 78.0–100.0)
Platelets: 83 10*3/uL — ABNORMAL LOW (ref 150–400)
Platelets: 93 10*3/uL — ABNORMAL LOW (ref 150–400)
Platelets: 88 10*3/uL — ABNORMAL LOW (ref 150–400)
RBC: 2.88 MIL/uL — ABNORMAL LOW (ref 3.87–5.11)
RBC: 2.91 MIL/uL — ABNORMAL LOW (ref 3.87–5.11)
RBC: 3.19 MIL/uL — ABNORMAL LOW (ref 3.87–5.11)
RBC: 3.17 MIL/uL — ABNORMAL LOW (ref 3.87–5.11)
RDW: 14.8 % (ref 11.5–15.5)
RDW: 14.8 % (ref 11.5–15.5)
RDW: 14.1 % (ref 11.5–15.5)
WBC: 5.3 10*3/uL (ref 4.0–10.5)
WBC: 6.8 10*3/uL (ref 4.0–10.5)
WBC: 6.3 10*3/uL (ref 4.0–10.5)

## 2010-10-10 LAB — BLOOD GAS, ARTERIAL
Bicarbonate: 28.4 mEq/L — ABNORMAL HIGH (ref 20.0–24.0)
Bicarbonate: 34.6 mEq/L — ABNORMAL HIGH (ref 20.0–24.0)
Bicarbonate: 30 mEq/L — ABNORMAL HIGH (ref 20.0–24.0)
FIO2: 0.21 %
O2 Saturation: 83.2 %
O2 Saturation: 92.6 %
Patient temperature: 98.6
Patient temperature: 98.6
Patient temperature: 98.6
TCO2: 30.2 mmol/L (ref 0–100)
TCO2: 36.1 mmol/L (ref 0–100)
TCO2: 36.7 mmol/L (ref 0–100)
pCO2 arterial: 56.5 mmHg — ABNORMAL HIGH (ref 35.0–45.0)
pCO2 arterial: 69.4 mmHg (ref 35.0–45.0)
pH, Arterial: 7.469 — ABNORMAL HIGH (ref 7.350–7.400)
pH, Arterial: 7.258 — ABNORMAL LOW (ref 7.350–7.400)
pH, Arterial: 7.408 — ABNORMAL HIGH (ref 7.350–7.400)
pO2, Arterial: 51.2 mmHg — ABNORMAL LOW (ref 80.0–100.0)

## 2010-10-10 LAB — URINALYSIS, ROUTINE W REFLEX MICROSCOPIC
Bilirubin Urine: NEGATIVE
Specific Gravity, Urine: 1.024 (ref 1.005–1.030)
pH: 6 (ref 5.0–8.0)

## 2010-10-10 LAB — URINE MICROSCOPIC-ADD ON

## 2010-10-10 LAB — COMPREHENSIVE METABOLIC PANEL
ALT: 23 U/L (ref 0–35)
ALT: 32 U/L (ref 0–35)
AST: 21 U/L (ref 0–37)
Albumin: 2.5 g/dL — ABNORMAL LOW (ref 3.5–5.2)
Albumin: 3 g/dL — ABNORMAL LOW (ref 3.5–5.2)
Alkaline Phosphatase: 59 U/L (ref 39–117)
Alkaline Phosphatase: 71 U/L (ref 39–117)
BUN: 16 mg/dL (ref 6–23)
CO2: 30 mEq/L (ref 19–32)
Calcium: 7.7 mg/dL — ABNORMAL LOW (ref 8.4–10.5)
Chloride: 102 mEq/L (ref 96–112)
Creatinine, Ser: 0.62 mg/dL (ref 0.4–1.2)
GFR calc Af Amer: >60 mL/min (ref >60)
GFR calc non Af Amer: >60 mL/min (ref >60)
Glucose, Bld: 155 mg/dL — ABNORMAL HIGH (ref 70–99)
Glucose, Bld: 179 mg/dL — ABNORMAL HIGH (ref 70–99)
Potassium: 4.2 mEq/L (ref 3.5–5.1)
Potassium: 4.3 mEq/L (ref 3.5–5.1)
Sodium: 137 mEq/L (ref 135–145)
Sodium: 137 mEq/L (ref 135–145)
Total Bilirubin: 0.8 mg/dL (ref 0.3–1.2)
Total Protein: 5.2 g/dL — ABNORMAL LOW (ref 6.0–8.3)
Total Protein: 5.9 g/dL — ABNORMAL LOW (ref 6.0–8.3)

## 2010-10-10 LAB — BRAIN NATRIURETIC PEPTIDE: Pro B Natriuretic peptide (BNP): 456 pg/mL — ABNORMAL HIGH (ref 0.0–100.0)

## 2010-10-10 LAB — POCT I-STAT, CHEM 8
BUN: 18 mg/dL (ref 6–23)
Calcium, Ion: 1.06 mmol/L — ABNORMAL LOW (ref 1.12–1.32)
Chloride: 97 mEq/L (ref 96–112)
Creatinine, Ser: 0.8 mg/dL (ref 0.4–1.2)
Glucose, Bld: 122 mg/dL — ABNORMAL HIGH (ref 70–99)
HCT: 29 % — ABNORMAL LOW (ref 36.0–46.0)
Hemoglobin: 9.9 g/dL — ABNORMAL LOW (ref 12.0–15.0)
Potassium: 3.5 mEq/L (ref 3.5–5.1)
Sodium: 137 mEq/L (ref 135–145)
TCO2: 32 mmol/L (ref 0–100)

## 2010-10-10 LAB — LACTATE DEHYDROGENASE: LDH: 168 U/L (ref 94–250)

## 2010-10-10 LAB — ANTISTREPTOLYSIN O TITER
ASO: <25 IU/mL (ref 0–116)
ASO: <25 IU/mL (ref 0–116)

## 2010-10-10 LAB — HSV(HERPES SIMPLEX VRS) I + II AB-IGG: Herpes Simplex Vrs I + II Ab, IgG: 16.2 IV — ABNORMAL HIGH

## 2010-10-10 LAB — JO-1 ANTIBODY-IGG: Jo-1 Antibody, IgG: 3 AU/mL (ref <30)

## 2010-10-10 LAB — MRSA PCR SCREENING: MRSA by PCR: NEGATIVE

## 2010-10-10 LAB — HSV 1 ANTIBODY, IGG: HSV 1 Glycoprotein G Ab, IgG: <0.01 IV

## 2010-10-10 LAB — MAGNESIUM: Magnesium: 2 mg/dL (ref 1.5–2.5)

## 2010-10-11 LAB — COMPREHENSIVE METABOLIC PANEL
ALT: 39 U/L — ABNORMAL HIGH (ref 0–35)
Alkaline Phosphatase: 74 U/L (ref 39–117)
Alkaline Phosphatase: 89 U/L (ref 39–117)
BUN: 13 mg/dL (ref 6–23)
BUN: 18 mg/dL (ref 6–23)
CO2: 28 mEq/L (ref 19–32)
CO2: 29 mEq/L (ref 19–32)
Calcium: 7.8 mg/dL — ABNORMAL LOW (ref 8.4–10.5)
Chloride: 98 mEq/L (ref 96–112)
Creatinine, Ser: 0.74 mg/dL (ref 0.4–1.2)
GFR calc non Af Amer: >60 mL/min (ref >60)
GFR calc non Af Amer: >60 mL/min (ref >60)
Glucose, Bld: 145 mg/dL — ABNORMAL HIGH (ref 70–99)
Glucose, Bld: 160 mg/dL — ABNORMAL HIGH (ref 70–99)
Potassium: 3.7 mEq/L (ref 3.5–5.1)
Sodium: 133 mEq/L — ABNORMAL LOW (ref 135–145)
Total Bilirubin: 0.9 mg/dL (ref 0.3–1.2)

## 2010-10-11 LAB — CBC
HCT: 36.6 % (ref 36.0–46.0)
HCT: 38.3 % (ref 36.0–46.0)
Hemoglobin: 13.2 g/dL (ref 12.0–15.0)
MCHC: 34.7 g/dL (ref 30.0–36.0)
MCV: 102.7 fL — ABNORMAL HIGH (ref 78.0–100.0)
MCV: 102.9 fL — ABNORMAL HIGH (ref 78.0–100.0)
MCV: 103.5 fL — ABNORMAL HIGH (ref 78.0–100.0)
Platelets: 88 10*3/uL — ABNORMAL LOW (ref 150–400)
Platelets: 90 10*3/uL — ABNORMAL LOW (ref 150–400)
RBC: 3.45 MIL/uL — ABNORMAL LOW (ref 3.87–5.11)
RBC: 3.7 MIL/uL — ABNORMAL LOW (ref 3.87–5.11)
RDW: 14.1 % (ref 11.5–15.5)
WBC: 5.4 10*3/uL (ref 4.0–10.5)
WBC: 4.2 10*3/uL (ref 4.0–10.5)

## 2010-10-11 LAB — HEMOGLOBIN A1C: Mean Plasma Glucose: 140 mg/dL — ABNORMAL HIGH (ref <117)

## 2010-10-11 LAB — PROTIME-INR
INR: 4.81 — ABNORMAL HIGH (ref 0.00–1.49)
INR: 5.57 (ref 0.00–1.49)
Prothrombin Time: 27.9 seconds — ABNORMAL HIGH (ref 11.6–15.2)

## 2010-10-11 LAB — DIFFERENTIAL
Basophils Absolute: 0 10*3/uL (ref 0.0–0.1)
Basophils Absolute: 0 10*3/uL (ref 0.0–0.1)
Basophils Relative: 0 % (ref 0–1)
Basophils Relative: 0 % (ref 0–1)
Eosinophils Absolute: 0 10*3/uL (ref 0.0–0.7)
Lymphocytes Relative: 9 % — ABNORMAL LOW (ref 12–46)
Monocytes Absolute: 0.4 10*3/uL (ref 0.1–1.0)
Monocytes Relative: 8 % (ref 3–12)
Neutro Abs: 3.9 10*3/uL (ref 1.7–7.7)
Neutro Abs: 4.7 10*3/uL (ref 1.7–7.7)
Neutrophils Relative %: 83 % — ABNORMAL HIGH (ref 43–77)
Neutrophils Relative %: 87 % — ABNORMAL HIGH (ref 43–77)

## 2010-10-11 LAB — BASIC METABOLIC PANEL
Chloride: 100 mEq/L (ref 96–112)
GFR calc Af Amer: >60 mL/min (ref >60)
Potassium: 4.2 mEq/L (ref 3.5–5.1)

## 2010-10-11 LAB — CULTURE, BLOOD (ROUTINE X 2): Culture: NO GROWTH

## 2010-10-11 LAB — BRAIN NATRIURETIC PEPTIDE
Pro B Natriuretic peptide (BNP): 242 pg/mL — ABNORMAL HIGH (ref 0.0–100.0)
Pro B Natriuretic peptide (BNP): 393 pg/mL — ABNORMAL HIGH (ref 0.0–100.0)

## 2010-10-11 LAB — CK TOTAL AND CKMB (NOT AT ARMC)
CK, MB: 3.2 ng/mL (ref 0.3–4.0)
Relative Index: 2.9 — ABNORMAL HIGH (ref 0.0–2.5)
Total CK: 111 U/L (ref 7–177)

## 2010-10-11 LAB — LIPID PANEL: Cholesterol: 131 mg/dL (ref 0–200)

## 2010-10-11 LAB — CARDIAC PANEL(CRET KIN+CKTOT+MB+TROPI)
CK, MB: 4.7 ng/mL — ABNORMAL HIGH (ref 0.3–4.0)
Relative Index: 2.6 — ABNORMAL HIGH (ref 0.0–2.5)
Total CK: 181 U/L — ABNORMAL HIGH (ref 7–177)
Total CK: 198 U/L — ABNORMAL HIGH (ref 7–177)

## 2010-10-11 LAB — APTT: aPTT: 66 seconds — ABNORMAL HIGH (ref 24–37)

## 2010-10-11 LAB — TROPONIN I: Troponin I: 0.02 ng/mL (ref 0.00–0.06)

## 2010-12-02 DIAGNOSIS — G309 Alzheimer's disease, unspecified: Secondary | ICD-10-CM | POA: Insufficient documentation

## 2010-12-05 NOTE — Letter (Signed)
September 29, 2008    Lenon Curt. Chilton Si, MD  [address]  [place], [place] [address]   RE:  Nicole Roach, Nicole Roach  MRN:  [id number]  /  DOB:  [birth year]   Dear Nicole Roach,   Nicole Roach, who is now your patient out at [facility] is one that Dr.  Gala Romney and I have followed for atrial fibrillation.  She has been  doing quite well and has no complaints related to her arrhythmia.  She  has a history of hypertension as you know and she is on Coumadin.   Her other medications we think are lisinopril and HCT, but she forgot to  bring her list with her today.   On examination, her blood pressure was 152/70 with pulse of 86, he  weight was 177 which was stable.  Her lungs were clear.  Heart sounds  were irregular.  The abdomen was soft.  The extremities were without  edema.   Electrocardiogram dated today demonstrated sinus rhythm at 86 with  intervals of 0.26/0.08/0.34.   IMPRESSION:  1. Atrial fibrillation - paroxysmal.  2. Hypertension.  3. First-degree atrioventricular block.   Nicole Roach, Nicole Roach, is stable from arrhythmia point of view.  She has no  cardiovascular complaints.  We will be glad to see her again at your  request.  If there is anything further we can do, please do not hesitate  to contact us.    Sincerely,      Duke Salvia, MD, [facility]  Electronically Signed    SCK/MedQ  DD: 09/29/2008  DT: 09/30/2008  Job #: [id number]

## 2010-12-05 NOTE — Assessment & Plan Note (Signed)
[facility]                            CARDIOLOGY OFFICE NOTE   NAME:Nicole Roach, Nicole Roach                     MRN:          [id number]  DATE:03/15/2008                            DOB:          [birth year]    PRIMARY CARE PHYSICIAN:  Nicole Leriche A. Perini, MD   INTERVAL HISTORY:  Nicole Roach is a delightful 77 year old woman with a  history of tachybrady syndrome with paroxysmal atrial fibrillation,  hypertension, complicated by orthostatic hypotension and previous left  eye retinal detachment who returns for followup.   She saw Nicole Roach back in June who reviewed her Holter monitor, which  showed significant bradycardia and evidence of chronotropic  incompetence.  He did discuss with her the possibility of a backup  bradycardia pacing, but as she was relatively asymptomatic, they decided  to defer that. He is scheduled to see her again next week.   Overall, she says she is doing pretty well.  She has been taking her  blood pressure occasionally at home and blood systolics have been  running in the 150s-160s, but after sitting, it goes down to 140s.  She  continues to walk every morning for about 30-45 minutes for about 1.5-2  miles without any problems.  She does note that over the past few  months, she has had episodes where she feels occasionally woozy.  She  really does not describe this as a syncopal feeling, just feels woozy,  these are relatively new for her.  She has not had any syncope or  palpitations.   CURRENT MEDICATIONS:  1. Warfarin.  2. Exelon patch.  3. Lisinopril 5 a day.  4. HCTZ 12.5 a day.   PHYSICAL EXAMINATION:  GENERAL:  She is an elderly woman who looks  younger than her stated age.  She is mentally very spry and ambulates on  the clinic without respiratory difficulty.  VITAL SIGNS:  Lying blood pressure is 148/72 with a heart rate of 66, on  sitting it is 150/72 with a heart rate of 72, and standing it is 156/70  with a heart  rate of 64.  HEENT:  Normal.  NECK:  Supple.  There is no JVD.  Carotids are 2+ bilaterally without  bruits.  There is no lymphadenopathy or thyromegaly.  She is status post  previous thyroid surgery with a scar.  CARDIAC:  She is  regular rate and rhythm with no murmurs, rubs or  gallops.  LUNGS:  Clear.  ABDOMEN:  Obese, nontender, and nondistended.  No hepatosplenomegaly.  No bruits.  No masses.  Good bowel sounds.  EXTREMITIES:  Warm with no  cyanosis, clubbing or edema.  NEURO:  Alert and oriented x3.  Cranial nerves II-XII are intact.  Moves  all 4 extremities without difficulty.  Affect is pleasant.   EKGs appears to be sinus rhythm, but the P-waves are very small and  maybe a junctional rhythm at a rate of 62, and no ST-T wave  abnormalities.   ASSESSMENT AND PLAN:  1. Paroxysmal atrial fibrillation, complicated by sick sinus syndrome      and bradycardia.  She currently looks to be in sinus rhythm (but      versus a junctional rhythm).  I am somewhat concerned that some of      her woozy episodes may be related to bradycardia.  She is due to      see Nicole Roach again to further discuss backup bradycardia pacing.      I look forward to his input.  We may do well by just continuing to      follow empirically.  2. Hypertension, complicated by orthostatic hypotension.  Blood      pressure is mildly elevated.  She is not orthostatic today.  Given      her previous symptoms, it may be best to let her blood pressure      trend a little bit on the higher side in the 130-140 range.  That      said, she may still benefit from bumping her lisinopril perhaps up      to 10 mg a day.  I leave this decision to Dr. Waynard Roach.   DISPOSITION:  I will see her back in 6 months or so for routine  followup.  We will continue her Coumadin for atrial fibrillation.      Nicole Buckles. Bensimhon, MD  Electronically Signed    DRB/MedQ  DD: 03/15/2008  DT: 03/16/2008  Job #: [id number]   cc:   Nicole Leriche A.  Roach, M.D.

## 2010-12-05 NOTE — Assessment & Plan Note (Signed)
[facility]                            CARDIOLOGY OFFICE NOTE   NAME:Gruszka, SAMAIYAH HOWES                     MRN:          [id number]  DATE:11/26/2007                            DOB:          July 11, 1922    PRIMARY CARE PHYSICIAN:  Loraine Leriche A. Perini, M.D.   INTERVAL HISTORY.:  Bernise is a delightful 77 year old woman with a  history of paroxysmal atrial fibrillation, hypertension complicated by  orthostatic hypotension and previous left eye retinal detachment who  presents for followup.   I first met her in the ER about a month ago when she presented with  paroxysmal atrial fibrillation.  We treated her with a dose of beta  blocker.  She spontaneously converted to sinus rhythm.  We then started  her on Coumadin.  She is seen last week in clinic here after she  developed some momentary lightheadedness and orthostasis in exercise  class.  Blood pressure was reportedly 84/58 with a heart rate of 46.  On  physical, she was orthostatic at the time.  Her  Diovan/hydrochlorothiazide was stopped. She was significantly  orthostatic the time with her blood pressure initially was 113/76 while  lying down and standing up it went to 160/60 and then eventually to down  to 98/70.  Thus her Diovan/hydrochlorothiazide was stopped.   She returns today to followup.  She says she is feeling fine.  She says  every once in a while feels lightheaded, but no syncope or pre-syncope.  No palpitations.  She had a 48-hour monitor put on today.   CURRENT MEDICATIONS:  1. Warfarin.  2. Metoprolol 12.5 b.i.d.  3. Exelon patch.   PHYSICAL EXAM:  She is an elderly woman in no acute this distress.  Ambulates around the clinic without respiratory difficulty.  Blood  pressure on lying down was 142/63 with a heart rate of 49; when she  stood up her blood pressure went to 150/65.  Heart rate of 58.  HEENT:  is normal.  NECK:  Supple.  There is no JVD.  Carotids are 2+ bilaterally  without  bruits. There is no lymphadenopathy or thyromegaly.  CARDIAC:  PMI is  nondisplaced.  She is bradycardic and regular with S4; no murmur.  LUNGS:  Clear.  ABDOMEN:  Soft, nontender, nondistended. No hepatosplenomegaly, no  bruits, no masses.  Good bowel sounds.  EXTREMITIES:  Warm with no  cyanosis, clubbing or edema no rash.  NEURO:  Alert and x3.  Cranial nerves II-XII intact.  Moves all four  extremities without difficulty.  Affect is pleasant.   EKG shows marked sinus bradycardia rate of 49; no ST-T wave  abnormalities.   ASSESSMENT/PLAN:  1. Paroxysmal atrial fibrillation complicated by tachybrady syndrome.      We will go ahead and stop her beta-blocker and see where heart rate      is.  I suspect she will eventually need a pacemaker.  But if her      resting heart rate comes up,  I think we can forego this for awhile      that is unless she has another  episode of atrial fibrillation at      which point she would need rate control medication anyway.  I have      discussed this with Dr. Waynard Edwards.  Obviously, if she has profound      bradycardia on her monitor that would push me to move sooner on      this.  We will continue Coumadin as her [place] score is 3 and her      risk of falling is relatively low.  2. Hypertension, this is complicated by orthostatic hypotension. Her      Diovan was recently stopped.  If her blood pressure remains      elevated, we may need to reconsider having her start on an      antihypertensive; this is followed by Dr. Waynard Edwards.   DISPOSITION:  Will get back to her pending the results of her monitor.  I will see her back for routine follow-up in just a few weeks.     Bevelyn Buckles. Bensimhon, MD  Electronically Signed    DRB/MedQ  DD: 11/26/2007  DT: 11/26/2007  Job #: [id number]

## 2010-12-05 NOTE — Letter (Signed)
December 23, 2007    Nicole Buckles. Bensimhon, MD  [address], [place] [address]   RE:  Nicole, Roach  MRN:  [id number]  /  DOB:  June 13, 1922   Dear Nicole Roach:   It was a pleasure to see Nicole Roach today because of her bradycardia  as identified on her Holter monitor.   She has a history of paroxysmal atrial fibrillation with an apparently  controlled ventricular response.  She has a history of hypertension, and  she was found when she was first identified as in atrial fibrillation as  been hypotensive and symptomatically orthostatic.  Her diuretics and  antihypertensives were subsequently discontinued, but her bradycardia  was concerning, and so a Holter monitor was obtained which had a mean  heart rate of 54, an interim heart rate in the low 40s-high 30s, and a  maximum heart rate of 88.  Heart rates were noted in the evenings to be  as low as 40 and earlier in the 80s as noted. There was some difficulty  looking at the baseline of a Holter, but I was not altogether sure a  junctional rhythm or sinus rhythm with significant first-degree AV block  was not the dominant one.  Notably, however, the patient denies any  symptoms of exercise intolerance.  Apart for these episodes of  hypotension, she also denies any syncope or presyncope.   PAST MEDICAL HISTORY:  Notable for hypertension and macular  degeneration.   PAST SURGICAL HISTORY:  Notable for:  1. Tonsillectomy.  2. Thyroidectomy.  3. Hip replacement.  4. Hysterectomy.   ALLERGIES:  She has no known drug allergies.   CURRENT MEDICATIONS:  Include Exelon patch and warfarin. Her  antihypertensives have been discontinued.   SOCIAL HISTORY:  She lives at [facility].  She is not married.  She has  no children.  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   PHYSICAL EXAMINATION:  GENERAL:  She is an elderly Caucasian female  appearing her stated age of 67.  VITAL SIGNS:  Her blood pressure is 157/95 with  pulse of 86. Weight was  186.  HEENT:  Exam demonstrated no icterus or xanthoma.  NECK:  The neck veins were flat.  The carotids are brisk and full  bilaterally without bruits.  BACK:  Without kyphosis or scoliosis.  LUNGS:  Clear.  HEART:  Sounds were regular without murmurs or gallops.  ABDOMEN:  Soft with active bowel sounds without midline pulsation or  hepatomegaly.  EXTREMITIES:  Femoral pulses were 2+. Distal pulses were intact.  There  was no clubbing, cyanosis or edema.  NEUROLOGIC:  Exam was grossly normal.  SKIN:  Warm and dry.   Holter monitor was as noted previously.   Electrocardiogram demonstrated sinus rhythm with very low amplitude P  waves supporting the impression from the Holter that this was, in fact,  sinus bradycardia.   IMPRESSION:  1. Sinus bradycardia with evidence of chronotropic incompetence,      largely asymptomatic.  2. Hypertension.  3. Orthostatic intolerance.   Nicole Roach, Nicole Roach, has moderate bradycardia chronotropic incompetence but  is largely asymptomatic and is not at all interested in backup brady  pacing.  In the absence of symptoms, I think that is not unreasonable   As relates to orthostatic intolerance, it is, as you know, a difficult  conundrum in patients with systolic hypertension.  Sometimes using  Mestinon in conjunction with antihypertensive therapy and/or ProAmatine  has been  used, it preventing adrenergic neurotransmitter degradation  which is evoked by standing is its postulated mechanism (kind of, sort  of).   I told her I would be glad to see her again in 3-4 months to follow up  with her about this.   Thanks very much for asking me to see her.    Sincerely,      Duke Salvia, MD, [facility]  Electronically Signed    SCK/MedQ  DD: 12/23/2007  DT: 12/23/2007  Job #: [id number]   CC:    Loraine Leriche A. Perini, M.D.

## 2010-12-05 NOTE — Letter (Signed)
April 20, 2008    Mark A. Perini, MD  [address]  [place], [place] [address]   RE:  Nicole Roach, Nicole Roach  MRN:  [id number]  /  DOB:  1922-03-23   Dear Loraine Leriche,   Nicole Roach came in today as you know because of the episode of atrial  fibrillation that occurred in the context of her probable pneumonia.  It  was associated with a rapid ventricular response and upon its  termination, she ended up having heart rates in the 40s.  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   We discussed whether it was time to go ahead and put a pacemaker in and  as you and I had anticipated of the two options, she favored just  continuing to follow it along and see how it is that it declared itself.  I think that is reasonable.   CURRENT MEDICATIONS:  1. Warfarin.  2. Lisinopril 5.  3. Hydrochlorothiazide 12.5.   PHYSICAL EXAMINATION:  Her blood pressure was 129/62 with a pulse of 59.  The lungs were clear.  The heart sounds were regular.  The extremities  were without edema.   Mark, as we discussed, we will plan to hold off on the pacemaker at this  point.  I have told her that I would be glad to see her at any time at  your request.   Thanks very much for allowing [place] to see her.    Sincerely,      Duke Salvia, MD, [facility]  Electronically Signed    SCK/MedQ  DD: 04/20/2008  DT: 04/21/2008  Job #: [id number]

## 2010-12-05 NOTE — Assessment & Plan Note (Signed)
[facility]                            CARDIOLOGY OFFICE NOTE   NAME:Nicole Roach, Nicole Roach                     MRN:          [id number]  DATE:11/17/2007                            DOB:          [birth year]    PATIENT PROFILE:  This is an 77 year old Caucasian female with recent  diagnosis of paroxysmal atrial fibrillation who presents for followup  secondary to orthostasis.   PROBLEM LIST:  1. Paroxysmal atrial fibrillation.  2. Hypertension.  3. Orthostatic hypotension.  4. History of thyroid goiter status post surgery in 1950.  5. Status post right total hip arthroplasty.  6. Status post tonsillectomy.  7. History of left eye retinal detachment status post posterior      vitrectomy and detachment repair October 2005 with repeat surgery      August 2006.   PRIMARY CARDIOLOGIST:  Dr. Gala Romney   HISTORY OF PRESENT ILLNESS:  An 77 -year-old Caucasian female with the  above problem list.  She lives at [facility].  She had noted a several  week history of intermittent lightheadedness with position changes which  was very slight and hardly noticeable.  By April 9, she stood and felt  lightheaded for a second or so and this recurred later in the afternoon  and subsequently saw the nurse at [facility] and was found to be  orthostatic with an irregular heart rate or heartbeat.  Apparently, ECG  was performed showing atrial fibrillation.  She was since the [facility]  ED.  The ED she was in A fib and was treated with beta blocker therapy  and subsequently converted to sinus rhythm.  She was apparently seen by  Dr. Gala Romney and was started on beta blocker and Coumadin with  arrangements for Coumadin Clinic follow-up as well as followup with Dr.  Gala Romney.  Following discharge, she has seemingly tolerated her blood  pressures well although she still has had some momentary lightheadedness  with position changes.  Today, she was in exercise class and felt  like  she was going to get woozy and initially sat down and walked outside the  class to sit down.  The nurse then came and saw her and repeated  orthostatics and by their report her pressure dropped 84/58 with heart  rate of 46 while standing.  For this reason, she was sent to [place].  Here,  she is in sinus rhythm with frequent PACs and is asymptomatic.  She  denies any chest pain, shortness of breath, PND, orthopnea or edema.   ALLERGIES:  No known drug allergies.   CURRENT MEDICATIONS:  1. Coumadin as directed.  2. Diovan hydrochlorothiazide 320/25 mg daily.  3. Aspirin 81 mg daily.  4. She takes vitamins for her eyes daily.  5. Metoprolol 25 mg a half tablet b.i.d.   FAMILY HISTORY:  Father died of alcoholism in the 17s.  Mother died of  cancer in her 16s. She is an only child.   SOCIAL HISTORY:  She lives in [place] [facility] by herself.  She previously  smoked about 25-30 pack years, quitting 35 years ago.  She  denies any  alcohol.  She is exercising 1-2 times a week and walking about 25  minutes daily.   REVIEW OF SYSTEMS:  Has some frequency in urination and otherwise all  systems reviewed and negative with the exception what is mentioned in  the HPI.   PHYSICAL EXAM:  Blood pressure  lying 113/76, heart rate 74; sitting  115/73, heart rate 72; standing at zero minutes 160/60, heart rate 70;  at 2 minutes 98/62, heart rate 68; at 5 minutes 98/70, heart rate 68.  She is afebrile.  Respirations 16.  Pleasant Caucasian female in no  acute distress.  Awake, alert and oriented x3.  HEENT:  Normal nerves.  Nonfocal.  Skin is warm, dry without lesions or  masses.  NECK:  No bruits, JVD.  LUNGS:  Respirations are unlabored.  CARDIAC:  Rhythm regular S1, S2,  with frequent ectopic beats and  sometimes ectopic tachycardia while listening or while auscultating.  This is short-lived.  No murmurs.  ABDOMEN:  Round, soft, nontender.  Bowel sounds present in all four  quadrants.   EXTREMITIES: Warm and dry. Pink.  No clubbing, cyanosis or edema.  Dorsalis pedis and posterior tibial pulses are 2+ bilaterally.   ACCESSORY CLINICAL FINDINGS:  Chest x-ray shows sinus rhythm, first-  degree AV block and multiple PACs. TSH, BMET, echo and  48-hour Holter  are pending.   ASSESSMENT/PLAN:  1. Paroxysmal atrial fibrillation.  The patient is in sinus with PACs      today.  She remains on beta blocker therapy which may need to be      titrated at some point, although her resting heart rate is in the      60s to low 70s.  She remains on Coumadin therapy which is followed      in our Coumadin Clinic.  Will check a TSH, BMET and echo and have      her follow-up with Dr. Gala Romney in a couple weeks.  Also, will      place her on a 48-hour Holter monitor to get a better feel for the      frequency of her paroxysmal A fib which appears to be asymptomatic      as well as heart rates when she is in A fib.  2. Orthostasis.  The patient is orthostatic by blood pressure, but not      by heart rate.  She has been initiated on beta blocker therapy      which may end up needing to be titrated.  Will discontinued Diovan      HCT.  She does note that she feels dried out and is urinating      frequently.  We will check a BMET today.  I have advised her have      the nurse at [facility] check her blood pressure on daily basis so      that we get a better feel for where her blood pressure is at and      whether or not she needs additional therapy.   DISPOSITION:  Studies as above. Follow-up with Dr. Gala Romney in a couple  weeks.      Nicolasa Ducking, ANP  Electronically Signed      Noralyn Pick. Eden Emms, MD, [facility]  Electronically Signed   CB/MedQ  DD: 11/17/2007  DT: 11/17/2007  Job #: [id number]

## 2010-12-05 NOTE — Assessment & Plan Note (Signed)
[facility]                         ELECTROPHYSIOLOGY OFFICE NOTE   NAME:Goshorn, JONNE ROTE                     MRN:          [id number]  DATE:03/24/2008                            DOB:          10/17/1921    Nicole Roach is seen in followup for paroxysmal atrial fibrillation,  sinus node dysfunction, and junctional rhythm.  She has only occasional  wobbles.  She does not feel presyncopal.  She has also noted some  tendency to drift to her right while walking.  She does not think this  is significant enough to justify intervention.   CURRENT MEDICATIONS:  1. Warfarin.  2. Aspirin.  3. Lisinopril 5.  4. Hydrochlorothiazide 12.5.   PHYSICAL EXAMINATION:  VITAL SIGNS:  Blood pressure was 132/62 with a  pulse of 68.  LUNGS:  Clear.  CARDIAC:  Heart sounds were regular.  EXTREMITIES:  Without edema.   IMPRESSION:  1. Paroxysmal atrial fibrillation.  2. Bradycardia with sinus node dysfunction and junctional rhythm.  3. Hypertension with mild orthostatic intolerance.  4. Occasional wobbles, question related to the above.   Nicole Roach would like not to have proceed with pacemaker implantation  at this point.  Her symptoms are not sufficient to justify nor is her  bradycardia sufficiently slow to proceed in the absence of associated  symptoms.   We will be glad to see her again as needed.     Duke Salvia, MD, [facility]  Electronically Signed    SCK/MedQ  DD: 03/24/2008  DT: 03/25/2008  Job #: [id number]   cc:   Loraine Leriche A. Perini, M.D.

## 2010-12-08 NOTE — Op Note (Signed)
NAME:  Nicole Roach, Nicole Roach              ACCOUNT NO.:  [id number]   MEDICAL RECORD NO.:  [id number]          PATIENT TYPE:  AMB   LOCATION:  SDS                          FACILITY:  [facility]   PHYSICIAN:  Alford Highland. Rankin, M.D.   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   OPERATION PERFORMED:  1.  Posterior vitrectomy, left eye.  2.  Removal of silicone oil--nonmagnetic foreign body, right eye, implanted      material, left eye.   SURGEON:  Alford Highland. Rankin, M.D.   ANESTHESIA:  Local retrobulbar with monitored anesthesia control.   INDICATIONS FOR PROCEDURE:  The patient is an 77 year old woman who has  profound vision loss on the basis of age related maculopathy, subretinal  hemorrhage who underwent complex vitrectomy with implantation of silicone  oil to minimize the risk of spread of the age related maculopathy and  subretinal hemorrhage. The hemorrhage has now resolved.  Her condition is  quiescent.  This is now an attempt to remove the silicone oil so as to  maximize the potential for visual acuity and functioning.  The patient  understands the risks of anesthesia including the rare occurrence of death  but also to the eye from the condition as well as surgical repair including  but not limited to hemorrhage, infection, scarring, need for another  surgery, no change in vision, loss of vision and progression of disease  despite intervention.  After appropriate signed consent was obtained, the  patient was taken  to the operating room.   DESCRIPTION OF PROCEDURE:  In the operating room, appropriate monitoring was  followed by mild sedation.  0.75% Marcaine was delivered 5 mL retrobulbar  followed by an additional 5 mL laterally in the fashion of modified Darel Hong.  The left periocular region was sterilely prepped and draped in the  usual ophthalmic fashion.  A lid speculum was applied.  Conjunctival  peritomy was fashioned in each of the quadrants with the exception of  infranasal.  A 4 mm infusion secured 3.5 mm posterior to the limbus in the  infratemporal quadrant.  Placement in the vitreous cavity verified visually.  Supratemporal sclerotomy was then fashioned.  An 18 gauge Angiocath needle  on the extrusion system was then used to extract silicone oil from the  vitreous cavity without difficulty.  Remnants of silicone oil were removed  with vitrectomy instrumentation.  Illumination was then carried out through  the lens of the Biom system.  No complications occurred.  The internal  illumination was then used to confirm the retina was attached.  There were  no complications.  Superior sclerotomy was then closed.  The infusion was  removed  and  similarly closed.  Subconjunctival injection of antibiotic and steroid  applied. The conjunctiva was closed with 7-0 Vicryl.  A sterile patch and  Fox shield were applied.  The patient was then transferred to the recovery  room in good and stable condition.      Alford Highland Rankin, M.D.  Electronically Signed     GAR/MEDQ  D:  03/12/2005  T:  03/12/2005  Job:  [id number]

## 2010-12-08 NOTE — Op Note (Signed)
NAME:  REX, MAGEE              ACCOUNT NO.:  [id number]   MEDICAL RECORD NO.:  [id number]          PATIENT TYPE:  OIB   LOCATION:  NA                           FACILITY:  [facility]   PHYSICIAN:  Alford Highland. Rankin, M.D.   DATE OF BIRTH:  08-30-1921   DATE OF PROCEDURE:  05/15/2004  DATE OF DISCHARGE:                                 OPERATIVE REPORT   PREOPERATIVE DIAGNOSIS:  1.  Massive subretinal hemorrhage, left eye, secondary to age related      macular degeneration and choroidal neovascular membranes fovea, left      eye.  2.  Secondary retinal detachment secondary to #1.   POSTOPERATIVE DIAGNOSIS:  1.  Massive subretinal hemorrhage, left eye, secondary to age related      macular degeneration and choroidal neovascular membranes fovea, left      eye.  2.  Secondary retinal detachment secondary to #1.   PROCEDURE:  1.  Posterior vitrectomy with endolaser panphotocoagulation of the left eye      for ablation as well as retinopexy purposes.  2.  Repair of retinal detachment.  3.  Injection of pharmacological agent - Alteplase subretinal.  4.  Internal drainage retinotomy left eye with endodiathermy.  5.  Injection of vitreous substitute - silicone oil 5000 centistokes.   SURGEON:  Alford Highland. Rankin, M.D.   ANESTHESIA:  Local retrobulbar with monitored anesthesia care.   INDICATIONS FOR PROCEDURE:  The patient is an 77 year old woman who has  profound side vision and peripheral vision loss and threatened total loss of  the globe on the basis of massive subretinal hemorrhage encompassing the  inferotemporal and the inferior quadrants of the left eye.  This extends  from the macular region anterior to the equator.  This is an attempt to  trigger lysis of the subretinal clot so as to stabilize the peripheral  vision and the retinal condition.  The patient understands the risks of  anesthesia including the rare occurrence of death, loss to the eye,  including but not limited to  hemorrhage, infection, scarring, need for  further surgery, no change in vision, loss of vision, progression of disease  despite intervention.  After appropriate signed consent was obtained, she  was taken to the operating room.   DESCRIPTION OF PROCEDURE:  In the operating room, appropriate monitoring was  followed by mild sedation.  0.75% Marcaine was delivered 5 mL retrobulbar  and an additional 5 mL laterally in the fashion of modified Darel Hong.  The  left periocular region was sterilely prepped and draped in the usual  ophthalmic fashion.  A lid speculum was applied.  A conjunctival peritomy  was then fashioned in each of the quadrants with the exception of the  inferonasal.  The 4 mm infusion was secured 3.5 mm posterior to the limbus.  Placement in the vitreous cavity was verified visually.  A superior  sclerotomy was fashioned.  The microscope was placed into position with the  BIOM attached.  A core vitrectomy was then begun.  Difficulty was obtained  in trying to create a posterior vitreous detachment.  Finally, with active  suction, the posterior vitreous was elevated off the optic nerve macular  region and off the already detached retinal inferiorly.  This was elevated  into the equator for 360 degrees and trimmed.  At this time, a retinotomy  was fashioned in the inferotemporal quadrant over the apex of the subretinal  clot and Alteplase was injected via subretinal injector.  Drainage  retinotomy was then fashioned inferior to the optic nerve with  endodiathermy.  Fluid/fluid exchange did remove approximately 30% of the  subretinal hemorrhage which was mostly liquefied.  At this time, a fluid air  exchange completed overlying the macular region subretinal clot.  Most of  the retina reattached nicely except over the apex of the large clot which  was approximately 1/4 of one quadrant in the inferotemporal quadrant.  Endolaser photocoagulation was placed 360 degrees anterior to  the equator as  well as around the retinotomy site.  At  this time, all retinal fluid  hemorrhage was removed.  An air-silicone oil exchange, silicone was 5000  centistokes, was then injected passively.  The superior sclerotomy was  closed with 7-0 Vicryl suture.  The infusion was removed and sclerotomy  closed with 7-0 Vicryl suture.  The conjunctiva was closed with 7-0 Vicryl.  Subconjunctival injection of antibiotics were applied.  The patient  tolerated the procedure well without complications.       GAR/MEDQ  D:  05/15/2004  T:  05/15/2004  Job:  [id number]

## 2011-04-17 LAB — URINALYSIS, ROUTINE W REFLEX MICROSCOPIC
Glucose, UA: NEGATIVE
Hgb urine dipstick: NEGATIVE
Protein, ur: NEGATIVE
Specific Gravity, Urine: 1.009

## 2011-04-17 LAB — DIFFERENTIAL
Eosinophils Relative: 2
Lymphocytes Relative: 27
Lymphs Abs: 2
Monocytes Absolute: 0.6
Monocytes Relative: 8

## 2011-04-17 LAB — POCT I-STAT, CHEM 8
BUN: 41 — ABNORMAL HIGH
Creatinine, Ser: 1.3 — ABNORMAL HIGH
Sodium: 137
TCO2: 32

## 2011-04-17 LAB — CBC
HCT: 41.7
Hemoglobin: 14.1
Platelets: 151
RBC: 4.17
WBC: 7.3

## 2011-07-24 DIAGNOSIS — K5900 Constipation, unspecified: Secondary | ICD-10-CM

## 2011-07-24 HISTORY — DX: Constipation, unspecified: K59.00

## 2012-05-23 DIAGNOSIS — R634 Abnormal weight loss: Secondary | ICD-10-CM

## 2012-05-23 HISTORY — DX: Abnormal weight loss: R63.4

## 2012-12-01 ENCOUNTER — Non-Acute Institutional Stay (SKILLED_NURSING_FACILITY): Payer: No Typology Code available for payment source | Admitting: Geriatric Medicine

## 2012-12-01 ENCOUNTER — Encounter: Payer: Self-pay | Admitting: Geriatric Medicine

## 2012-12-01 DIAGNOSIS — F0280 Dementia in other diseases classified elsewhere without behavioral disturbance: Secondary | ICD-10-CM

## 2012-12-01 DIAGNOSIS — I10 Essential (primary) hypertension: Secondary | ICD-10-CM

## 2012-12-01 DIAGNOSIS — R05 Cough: Secondary | ICD-10-CM

## 2012-12-01 DIAGNOSIS — I4891 Unspecified atrial fibrillation: Secondary | ICD-10-CM

## 2012-12-01 DIAGNOSIS — G309 Alzheimer's disease, unspecified: Secondary | ICD-10-CM

## 2012-12-01 DIAGNOSIS — D649 Anemia, unspecified: Secondary | ICD-10-CM

## 2012-12-01 HISTORY — DX: Dementia in other diseases classified elsewhere, unspecified severity, without behavioral disturbance, psychotic disturbance, mood disturbance, and anxiety: F02.80

## 2012-12-01 NOTE — Assessment & Plan Note (Signed)
Asymptomatic, remains rate controlled. No anticoagulation due to persistent anemia and thrombocytopenia.

## 2012-12-01 NOTE — Assessment & Plan Note (Addendum)
Asymptomatic. Recheck CBC and B12 level.

## 2012-12-01 NOTE — Assessment & Plan Note (Addendum)
Patient with mild nonproductive cough so has mild anterior wheeze present. May be related to seasonal allergies, will treat with antihistamine for the next 7 days.

## 2012-12-01 NOTE — Assessment & Plan Note (Addendum)
Recent blood pressure range 106-139/5174. Pulse 63-82. Continue current medication diltiazem, furosemide. Check routine lab

## 2012-12-01 NOTE — Assessment & Plan Note (Signed)
Most recent MDS reviewed (11/2012) BIMS score with small decine, now 4/15. Functional status has declined; now requires significant assistance with dressing, bathing. Limited assist with toileting and hygiene. Continues to ambulate well but requires much assistance due to low vision. Occasionally incontinent of urine frequently incontinent of bowel. Review of records shows patient continues to eat well. She has had some mild weight loss but remains overweight.  Activity staff reports patient is pleasant and participates in a variety of programs. No behavior issues. Continue current medication, and supportive care

## 2012-12-04 NOTE — Progress Notes (Signed)
Patient ID: Nicole Roach, female   DOB: [birth year], 79 y.o.   MRN: [id number] [facility] [id number])  Chief Complaint  Patient presents with  . Cough  . Medical Managment of Chronic Issues    AD, HTN, AF, Thrombocytopenia, anemia    HPI: This is a 79 y.o. female resident of [facility],Skilled Nursing section evaluated today for a cough and management of ongoing medical issues.  Review of record shows patient was noted with a dry nonproductive cough yesterday, she reported she just didn't feel well. Nursing assessment includes afebrile and clear lung sounds. Cough has continued today. Patient continues with intermittent confusion due to Alzheimer's disease. She remains pleasant, cooperative;  participates in facility activities. Functional status has exhibited some decline. Vital signs and weight has been stable.  Bathing: Maximal Assist,  Bladder Management: Frequently incontinent , Bowel Management: Frequently incontinent Feeding: Minimal Assist,  Toileting / Clothing: Moderate Assist,  Walk: Stand by Assist, due to low vision  Allergies  Allergies  Allergen Reactions  . Avelox (Moxifloxacin Hcl In Nacl)   . Levaquin (Levofloxacin In D5w)   . Quinolones   . Tetanus Toxoids    Medications reviewed  Data Reviewed       OZH:YQMVHQI, external 12/04/11 WBC 3.7, Hgb 11.1, Hct 34.6, Plt 97 Glu 97, UN 30, Cr. .85, Na 139, K+ 4.6 01/10/12 urine cx: >100,000 P.mirabilis 02/04/12 U/A: Neg nitrate, neg. leuk.esterace, many bacteria 03/06/12: Glu 94, BUN 21, Cr. .72, Na 140, K+ 4.5 BNP 146 04/22/12 WBC 3.7, Hgb 12.5, Hct 35.6, Plt 103 06/24/12 Glu 94, BUN 23, Cr. .73, Na 140, K+ 4.3     Review of Systems  DATA OBTAINED: from patient, nurse, medical record,  GENERAL:  No fevers, fatigue, change in appetite or weight SKIN: No itch, rash or open wounds EYES: No eye pain, dryness or itching  Poor vision EARS: No earache, change in hearing NOSE: No  congestion, drainage or bleeding MOUTH/THROAT: No mouth or tooth pain  No difficulty chewing or swallowing No sore throat RESPIRATORY: Dry Cough  No wheezing, SOB CARDIAC: No chest pain,  No edema. GI: No abdominal pain  No N/V/D or constipation  No heartburn or reflux Frequent incontinence GU: No dysuria, frequency or urgency  No change in urine volume or character Frequent incontinence MUSCULOSKELETAL: No joint pain, swelling or stiffness  No back pain  No muscle ache, pain, weakness  Gait is steady  No recent falls.  NEUROLOGIC: No dizziness, fainting, headache,  No change in mental status (dementia).  PSYCHIATRIC: No feelings of anxiety, depression Sleeps well.  No behavior issue.   Physical Exam Filed Vitals:   12/01/12 1625  BP: 196/86  Pulse: 75  Temp: 97.6 F (36.4 C)  Resp: 16  Weight: 180 lb (81.647 kg)  SpO2: 95%   Body mass index is 27.38 kg/(m^2). GENERAL APPEARANCE: No acute distress, appropriately groomed, normal body habitus. Alert, pleasant, conversant. HEAD: Normocephalic, atraumatic EYES: Conjunctiva/lids clear.  NOSE: No deformity or discharge. MOUTH/THROAT: Lips w/o lesions. Oral mucosa, tongue moist, w/o lesion. Oropharynx w/o redness or lesions.  NECK: Supple, full ROM. No thyroid tenderness, enlargement or nodule LYMPHATICS: No head, neck or supraclavicular adenopathy RESPIRATORY: Breathing is even, unlabored. Lung sounds are clear and full.  CARDIOVASCULAR: Heart IRRR. No murmur or extra heart sounds  EDEMA: No peripheral or periorbital edema. No ascites GASTROINTESTINAL: Abdomen is soft, non-tender, not distended w/ normal bowel sounds.  MUSCULOSKELETAL: Moves all extremities with full ROM, strength and  tone. Back is without kyphosis, scoliosis or spinal process tenderness. Gait is steady NEUROLOGIC: Not oriented to time, place. Speech clear, no tremor.Marland Kitchen PSYCHIATRIC: Mood and affect appropriate to situation  ASSESSMENT/PLAN   ATRIAL FIBRILLATION,  PAROXYSMAL Asymptomatic, remains rate controlled. No anticoagulation due to persistent anemia and thrombocytopenia.  HYPERTENSION Recent blood pressure range 106-139/5174. Pulse 63-82. Continue current medication diltiazem, furosemide. Check routine lab  ANEMIA Asymptomatic. Recheck CBC and B12 level.   Alzheimer's disease Most recent MDS reviewed (11/2012) BIMS score with small decine, now 4/15. Functional status has declined; now requires significant assistance with dressing, bathing. Limited assist with toileting and hygiene. Continues to ambulate well but requires much assistance due to low vision. Occasionally incontinent of urine frequently incontinent of bowel. Review of records shows patient continues to eat well. She has had some mild weight loss but remains overweight.  Activity staff reports patient is pleasant and participates in a variety of programs. No behavior issues. Continue current medication, and supportive care   Cough Patient with mild nonproductive cough so has mild anterior wheeze present. May be related to seasonal allergies, will treat with antihistamine for the next 7 days.   Follow up: Routine or as needed  Lab 12/02/12: CBC, CMP, TSH, B12  Chastin Garlitz T.Adaly Puder, NP-C 12/01/2012

## 2013-03-16 ENCOUNTER — Encounter: Payer: Self-pay | Admitting: Geriatric Medicine

## 2013-03-16 ENCOUNTER — Non-Acute Institutional Stay (SKILLED_NURSING_FACILITY): Payer: No Typology Code available for payment source | Admitting: Geriatric Medicine

## 2013-03-16 DIAGNOSIS — I10 Essential (primary) hypertension: Secondary | ICD-10-CM

## 2013-03-16 DIAGNOSIS — D649 Anemia, unspecified: Secondary | ICD-10-CM

## 2013-03-16 DIAGNOSIS — H4312 Vitreous hemorrhage, left eye: Secondary | ICD-10-CM

## 2013-03-16 DIAGNOSIS — D696 Thrombocytopenia, unspecified: Secondary | ICD-10-CM

## 2013-03-16 DIAGNOSIS — F0280 Dementia in other diseases classified elsewhere without behavioral disturbance: Secondary | ICD-10-CM

## 2013-03-16 DIAGNOSIS — I519 Heart disease, unspecified: Secondary | ICD-10-CM

## 2013-03-16 DIAGNOSIS — I4891 Unspecified atrial fibrillation: Secondary | ICD-10-CM

## 2013-03-16 DIAGNOSIS — H4310 Vitreous hemorrhage, unspecified eye: Secondary | ICD-10-CM

## 2013-03-16 DIAGNOSIS — Z66 Do not resuscitate: Secondary | ICD-10-CM

## 2013-03-16 NOTE — Assessment & Plan Note (Signed)
Chronic low platelet count, no sign of bleeding. Update lab

## 2013-03-16 NOTE — Progress Notes (Signed)
Patient ID: Nicole Roach, female   DOB: [birth year], 79 y.o.   MRN: [id number] [facility] (31)  Code Status: Living well, HCPOA, DO NOT RESUSCITATE Contact Information   Name Relation Home Work Mobile   Clement,Starla Relative [phone]  [phone]       Chief Complaint  Patient presents with  . Medical Managment of Chronic Issues    Annual exam/ update  . Annual Exam    HPI: This is a 79 y.o. female resident of [facility]  section. This patient has not had a hospitalization, serious illness or injury in the last year. Patient has demonstrated minimal decline due to Alzheimer's dementia and poor vision. Patient continues to be interactive with staff, demonstrates mild decrease in her functional status, requiring more assistance with ADLs and direction. Vital signs, p.o. intake and weight have remained stable last several months. Most recent lab studies satisfactory.  Functional Status Bathing: Moderate Assist Bed Mobility: Moderate Assist Bladder Management: Diapers / Pads, Bowel Management: Diapers / Pads, Feeding: Supervision Hygiene and Grooming: Moderate Assist, Toileting / Clothing: Moderate Assist Transfers: Moderate Assist, Walk: Supervision     Allergies  Allergen Reactions  . Avelox [Moxifloxacin Hcl In Nacl]   . Levaquin [Levofloxacin In D5w]   . Quinolones   . Tetanus Toxoids    Medications    Medication List       This list is accurate as of: 03/16/13  3:53 PM.  Always use your most recent med list.               brimonidine-timolol 0.2-0.5 % ophthalmic solution  Commonly known as:  COMBIGAN  Place 1 drop into the left eye 2 (two) times daily.     chlorhexidine 0.12 % solution  Commonly known as:  PERIDEX  Use as directed 15 mLs in the mouth or throat 2 (two) times daily.     diltiazem 120 MG 24 hr capsule  Commonly known as:  CARDIZEM CD  Take 120 mg by mouth daily.     donepezil 10 MG tablet  Commonly known as:  ARICEPT  Take 10 mg by mouth at bedtime.     furosemide 40 MG tablet  Commonly known as:  LASIX  Take 40 mg by mouth daily.     NAMENDA XR 28 MG Cp24  Generic drug:  Memantine HCl ER  Take 1 capsule by mouth daily.     naproxen sodium 275 MG tablet  Commonly known as:  ANAPROX  Take 275 mg by mouth every 8 (eight) hours as needed.     nepafenac 0.1 % ophthalmic suspension  Commonly known as:  NEVANAC  1 drop 3 (three) times daily.     polyethylene glycol packet  Commonly known as:  MIRALAX / GLYCOLAX  Take 17 g by mouth daily.     potassium chloride 10 MEQ CR capsule  Commonly known as:  MICRO-K  Take 10 mEq by mouth daily.     PRESERVISION AREDS PO  Take 1 capsule by mouth daily.     senna 8.6 MG Tabs tablet  Commonly known as:  SENOKOT  Take 1 tablet by mouth at bedtime.     Vitamin D 2000 UNITS tablet  Take 2,000 Units by mouth daily.        DATA REVIEWED  Radiologic Exams:   List from [facility] 04/2007 - mammogram: nl exam 05/2008 - mammogram: nl exam. 06/2008 CXR: mild hyperinflation. NAD. Osteopenia 09/2009 EKG: Atrial fibrillation,  HR 73 BPM 10/13/09 Left rib x-ray: Mild- mod. osteopenia, no acute fx. or lytic lesion.  10/31/09 CXR: Patchy inflammatory infiltrate Rt. mid lung and LLL 11/03/09: 2D echo: LVEF 55-60%, diastolic dysfunction. Mild AoV stenosis. Mild MV regurgitation. RA w/ mild dilation. PA pressure mountain elevated. 11/04/09: Bedside swallow- no obvious swallow problem.  Recommend MBSS to r/o silent aspir. 11/05/09: MBSS: No esophageal issue. Moderate sensory/ motor oropharynxes dysphagia with moderate stasis across all consistencies. There was laryngeal penetration w/thin liquids not cleared w/ cued coughs. Question etiology progression of AD vs. deconditioning. 11/23/09 CXR: Stable vascular congestion/ cardiomegaly w/o overt pulmonary edema   [facility] x-ray 07/04/2012 chest x-ray: No evidence of  acute infiltrate is seen. No significant change from prior study of 12/14/2011   Cardiovascular Exams:   Laboratory Studies:  [facility] 12/02/2012 WBC 2.4, hemoglobin 10.7, hematocrit 30.7, platelets 68  Glucose 93, BUN 24, creatinine 0.82, sodium 139, potassium 3.9. LFTs WNL. Total protein 5.4, albumin 3.3, calcium 8.1.  TSH 2.48   Vitamin B12 1301  Past Medical History  Diagnosis Date  . Alzheimer's disease 12/01/2012  . Osteoarthrosis, unspecified whether generalized or localized, unspecified site 1999  . Unspecified essential hypertension 2000  . Macular degeneration (senile) of retina, unspecified 2005    Left eye, status post posterior vitrectomy  . Atrial fibrillation 2008    warfarin DC 2011 due to to anemia  . Unspecified vitamin D deficiency 2009  . Vitreous hemorrhage 2011  . Depressive disorder, not elsewhere classified 2011  . Anemia, unspecified 2011  . Thrombocytopenia, unspecified 2011  . Pneumonia, organism unspecified 2011  . Debility, unspecified 2011  . Diastolic heart failure 2011    2D echocardiogram w/ wevidence of diastolic dysfunction  . Primary pulmonary hypertension 2011  . Lipoma of other skin and subcutaneous tissue 2011    large lipoma rt. mid back  . Leukocytopenia, unspecified 2011  . Impacted cerumen 2011  . Conjunctivitis unspecified 2011  . Ganglion and cyst of synovium, tendon and bursa 2012  . Other B-complex deficiencies 2012  . Unspecified constipation 2013  . Loss of weight 05/2012   Past Surgical History  Procedure Laterality Date  . Tonsillectomy and adenoidectomy  1927  . Thyroidectomy, partial  1950s  . Total hip arthroplasty Right 1999  . Cataract extraction w/ intraocular lens  implant, bilateral Bilateral 2002  . Vitrectomy Left 2005    Posterior vitrectomy and repair  . Vitrectomy Left 2/ 2011   Family Status  Relation Status Death Age  . Father Deceased 63s    throat hemmorhage re: alcohol abuse  . 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 Lives in Skilled nursing section at [facility] since 02/2010. No children, no siblings   Stopped smoking 1982 history, minimal alcohol history.   Patient has Advanced planning documents: Living Will, DNR, HCPOA            Review of Systems  DATA OBTAINED: from patient, nurse, medical record GENERAL: Feels "OK, tired"   No fevers, fatigue, change in appetite or weight SKIN: No itch, rash or open wounds EYES: No eye pain, dryness or itching  No change in vision (poor vision) EARS: No earache, change in hearing NOSE: No congestion, drainage or bleeding MOUTH/THROAT: No mouth or tooth pain  No sore throat   No difficulty chewing or swallowing RESPIRATORY: No  cough, wheezing, SOB CARDIAC: No chest pain, palpitations  No edema. CHEST/BREASTS: No discomfort, discharge or lumps in breasts GI: No abdominal pain  No N/V/D or constipation  No heartburn or reflux   Frequent incontinence GU: No dysuria, frequency or urgency  No change in urine volume or character  Frequent incontinence  MUSCULOSKELETAL: No joint pain, swelling or stiffness  No back pain  No muscle ache, pain, weakness  Gait is steady w/ walker  No recent falls.  NEUROLOGIC: No dizziness, fainting, headache No change in mental status (dementia).  PSYCHIATRIC: No signs of anxiety, depression Sleeps well.  No behavior issue.    Physical Exam Filed Vitals:   03/16/13 1441  BP: 126/70  Pulse: 69  Temp: 97.5 F (36.4 C)  Resp: 20  Height: 5\' 7"  (1.702 m)  Weight: 181 lb 3.2 oz (82.192 kg)   Body mass index is 28.37 kg/(m^2).  GENERAL APPEARANCE: No acute distress, appropriately groomed, normal body habitus. Alert, pleasant, conversant. SKIN: No diaphoresis, rash, unusual lesions, wounds. Lipoma rt. Upper back unchnaged HEAD: Normocephalic, atraumatic EYES: Conjunctiva/lids clear.  Pupils small, round, patient reports light is brighter in her right eye  EARS: External exam WNL,  Hearing decreased, not new. NOSE: No deformity or discharge. MOUTH/THROAT: Lips w/o lesions. Oral mucosa, tongue moist, w/o lesion. Oropharynx w/o redness or lesions.  NECK: Supple, full ROM. No thyroid tenderness, enlargement or nodule LYMPHATICS: No head, neck or supraclavicular adenopathy RESPIRATORY: Breathing is even, unlabored. Lung sounds are clear and full.  CARDIOVASCULAR: Heart IRRR, bradycardic at 50-61BPM.  No murmur or extra heart sounds  ARTERIAL: No carotid bruit. Carotid, Rt.Femoral, DP pulse 2+, left femoral, DP pulses 1+  VENOUS: No varicosities. No venous stasis skin changes  EDEMA: Trace bilateral lower extremity edema. No ascites GASTROINTESTINAL: Abdomen is soft, non-tender, not distended w/ normal bowel sounds. No hepatic or splenic enlargement. No mass, ventral or inguinal hernia. GENITOURINARY: Bladder non tender, not distended. MUSCULOSKELETAL: Moves all extremities with full ROM, strength and tone. Back is without kyphosis, scoliosis or spinal process tenderness. Gait is steady w/ walker, requires some assist to rise from chair NEUROLOGIC: Not oriented to time, place, person.Speech clear, no tremor.  PSYCHIATRIC: Mood and affect appropriate to situation   ASSESSMENT/PLAN  HYPERTENSION Recent blood pressure range 126-136/5965, pulse 56-69. Update lab  ATRIAL FIBRILLATION, PAROXYSMAL Heart rhythm with mild irregularity, heart rate borderline low at 52-60 once a day. Reduce donepezil (side effect bradycardia)  DIASTOLIC DYSFUNCTION Weight is stable between 170 and 182 pounds on low dose daily diuretic. No respiratory issues no significant lower extremity edema. Continue current diuretic, update lab  Alzheimer's disease 8 2014 MDS reviewed. BIMS with mild decrease to 3/15, PHQ-9 3/27. Functional status with some decline since last review: requires extensive assist  with bed mobility, transfers, hygiene dressing. Limited assistance with walking and toileting. Requires supervision to eat. Some of her functional needs stem from her poor vision. Language skills remain intact, patient has very severe short-term memory loss does not recall she participates in activities. She does participate in activities when invited. Receives Aricept and Namenda, will decrease Aricept dose to 2 bradycardia.  Thrombocytopenia, unspecified Chronic low platelet count, no sign of bleeding. Update lab  ANEMIA Most recent lab with mild decrease in hemoglobin and hematocrit. Patient remains asymptomatic. Update lab  Vitreous hemorrhage Vision remains quite low, patient requires assistance with navigating hallways and in her room.    Follow up: Routine or as needed.  Lab 03/17/2013 CBC, CMP  Kingston Guiles T.Hoda Hon, NP-C 03/16/2013

## 2013-03-16 NOTE — Assessment & Plan Note (Signed)
Weight is stable between 170 and 182 pounds on low dose daily diuretic. No respiratory issues no significant lower extremity edema. Continue current diuretic, update lab

## 2013-03-16 NOTE — Assessment & Plan Note (Signed)
Heart rhythm with mild irregularity, heart rate borderline low at 52-60 once a day. Reduce donepezil (side effect bradycardia)

## 2013-03-16 NOTE — Assessment & Plan Note (Signed)
Vision remains quite low, patient requires assistance with navigating hallways and in her room.

## 2013-03-16 NOTE — Assessment & Plan Note (Signed)
Most recent lab with mild decrease in hemoglobin and hematocrit. Patient remains asymptomatic. Update lab

## 2013-03-16 NOTE — Assessment & Plan Note (Addendum)
8 2014 MDS reviewed. BIMS with mild decrease to 3/15, PHQ-9 3/27. Functional status with some decline since last review: requires extensive assist with bed mobility, transfers, hygiene dressing. Limited assistance with walking and toileting. Requires supervision to eat. Some of her functional needs stem from her poor vision. Language skills remain intact, patient has very severe short-term memory loss does not recall she participates in activities. She does participate in activities when invited. Receives Aricept and Namenda, will decrease Aricept dose due to bradycardia.

## 2013-03-16 NOTE — Assessment & Plan Note (Signed)
Recent blood pressure range 126-136/5965, pulse 56-69. Update lab

## 2013-06-01 ENCOUNTER — Encounter: Payer: Self-pay | Admitting: Geriatric Medicine

## 2013-06-01 ENCOUNTER — Non-Acute Institutional Stay (SKILLED_NURSING_FACILITY): Payer: No Typology Code available for payment source | Admitting: Geriatric Medicine

## 2013-06-01 DIAGNOSIS — I519 Heart disease, unspecified: Secondary | ICD-10-CM

## 2013-06-01 DIAGNOSIS — F0280 Dementia in other diseases classified elsewhere without behavioral disturbance: Secondary | ICD-10-CM

## 2013-06-01 DIAGNOSIS — I4891 Unspecified atrial fibrillation: Secondary | ICD-10-CM

## 2013-06-01 DIAGNOSIS — D696 Thrombocytopenia, unspecified: Secondary | ICD-10-CM

## 2013-06-01 NOTE — Assessment & Plan Note (Signed)
Heart in regular rhythm today, rate improved since lowering the dose of Aricept.

## 2013-06-01 NOTE — Progress Notes (Signed)
Patient ID: Nicole Roach, female   DOB: Jul 09, 1922, 79 y.o.   MRN: [id number] [facility] (31)  Code Status: Living well, HCPOA, DO NOT RESUSCITATE     Contact Information   Name Relation Home Work Mobile   Clement,Starla Relative [phone]  [phone]       Chief Complaint  Patient presents with  . Medical Managment of Chronic Issues    HPI: This is a 79 y.o. female resident of [facility],  Skilled Nursing section evaluated today for management of ongoing medical issues.  Review of facility record shows patient's vital signs including weight has been stable. Patient's heart rate has been in the 60s since Aricept dose decreased. P.o. intake is satisfactory; most meals greater than 50-75%. Most recent lab with satisfactory renal function, mild improvement in thrombocytopenia, anemia stable Quarterly MDS review was completed earlier this month; no significant change from previous evaluations this year.    Last visit: HYPERTENSION Recent blood pressure range 126-136/5965, pulse 56-69. Update lab  ATRIAL FIBRILLATION, PAROXYSMAL Heart rhythm with mild irregularity, heart rate borderline low at 52-60 once a day. Reduce donepezil (side effect bradycardia)  DIASTOLIC DYSFUNCTION Weight is stable between 178 and 182 pounds on low dose daily diuretic. No respiratory issues no significant lower extremity edema. Continue current diuretic, update lab  Alzheimer's disease 8 2014 MDS reviewed. BIMS with mild decrease to 3/15, PHQ-9 3/27. Functional status with some decline since last review: requires extensive assist with bed mobility, transfers, hygiene dressing. Limited assistance with walking and toileting. Requires supervision to eat. Some of her functional needs stem from her poor vision. Language skills remain intact, patient has very severe short-term memory loss does not recall she participates in activities. She does participate in  activities when invited. Receives Aricept and Namenda, will decrease Aricept dose due to bradycardia.  Thrombocytopenia, unspecified Chronic low platelet count, no sign of bleeding. Update lab  ANEMIA Most recent lab with mild decrease in hemoglobin and hematocrit. Patient remains asymptomatic. Update lab  Vitreous hemorrhage Vision remains quite low, patient requires assistance with navigating hallways and in her room.   Functional Status Bathing: Moderate Assist Bed Mobility: Moderate Assist Bladder Management: Diapers / Pads, Bowel Management: Diapers / Pads, Feeding: Supervision Hygiene and Grooming: Moderate Assist, Toileting / Clothing: Moderate Assist Transfers: Moderate Assist, Walk: Supervision     Allergies  Allergen Reactions  . Avelox [Moxifloxacin Hcl In Nacl]   . Levaquin [Levofloxacin In D5w]   . Quinolones   . Tetanus Toxoids    Medications reviewed   DATA REVIEWED  Radiologic Exams:   List [facility] 11/04/09: Bedside swallow- no obvious swallow problem.  Recommend MBSS to r/o silent aspir. 11/05/09: MBSS: No esophageal issue. Moderate sensory/ motor oropharynxes dysphagia with moderate stasis across all consistencies. There was laryngeal penetration w/thin liquids not cleared w/ cued coughs. Question etiology progression of AD vs. deconditioning. 11/23/09 CXR: Stable vascular congestion/ cardiomegaly w/o overt pulmonary edema   [facility] 07/04/2012 chest x-ray: No evidence of acute infiltrate is seen. No significant change from prior study of 12/14/2011   Cardiovascular Exams 09/2009 EKG: Atrial fibrillation, HR 73 BPM 11/03/09: 2D echo: LVEF 55-60%, diastolic dysfunction. Mild AoV stenosis. Mild MV regurgitation. RA w/ mild dilation. PA pressure mountain elevated.   Laboratory Studies:  [facility] 12/02/2012 WBC 2.4, hemoglobin 10.7, hematocrit 30.7, platelets 68  Glucose 93, BUN 24, creatinine 0.82, sodium 139, potassium 3.9. LFTs WNL.  Total protein 5.4, albumin 3.3, calcium 8.1.  TSH 2.48   Vitamin B12 1301 03/17/2013 WBC 2.7, hemoglobin 10 2, hematocrit 31.0, platelets 80  Glucose 88, BUN 30, creatinine 1.02, sodium 139, potassium 3.8. Albumin 3.4. LFTs WNL.   Review of Systems  DATA OBTAINED: from patient, nurse, medical record GENERAL: Feels "OK"   No fevers, fatigue, change in appetite or weight SKIN: No itch, rash or open wounds EYES: No eye pain, dryness or itching  No change in vision (poor vision) EARS: No earache, change in hearing NOSE: No congestion, drainage or bleeding MOUTH/THROAT: No mouth or tooth pain  No sore throat   No difficulty chewing or swallowing RESPIRATORY: No cough, wheezing, SOB CARDIAC: No chest pain, palpitations  No edema. CHEST/BREASTS: No discomfort, discharge or lumps in breasts GI: No abdominal pain  No N/V/D or constipation  No heartburn or reflux   Frequent incontinence GU: No dysuria, frequency or urgency  No change in urine volume or character  Frequent incontinence  MUSCULOSKELETAL: No joint pain, swelling or stiffness  No back pain  No muscle ache, pain, weakness  Gait is steady w/ walker  No recent falls.  NEUROLOGIC: No dizziness, fainting, headache No change in mental status (dementia).  PSYCHIATRIC: No signs of anxiety, depression Sleeps well.  No behavior issue.    Physical Exam Filed Vitals:   06/01/13 1515  BP: 121/55  Pulse: 59  Temp: 97 F (36.1 C)  Resp: 17  Weight: 179 lb 12.8 oz (81.557 kg)  SpO2: 95%   Body mass index is 28.15 kg/(m^2).  GENERAL APPEARANCE: No acute distress, appropriately groomed, normal body habitus. Alert, pleasant, conversant. SKIN: No diaphoresis, rash, unusual lesions, wounds. Lipoma rt. Upper back unchnaged HEAD: Normocephalic, atraumatic EYES: Conjunctiva/lids clear. Pupils small, round, patient reports light is brighter in her right eye  EARS: External exam WNL,  Hearing decreased, not new. NOSE: No deformity or  discharge. MOUTH/THROAT: Lips w/o lesions. Oral mucosa, tongue moist, w/o lesion. Oropharynx w/o redness or lesions.  NECK: Supple, full ROM. No thyroid tenderness, enlargement or nodule LYMPHATICS: No head, neck or supraclavicular adenopathy RESPIRATORY: Breathing is even, unlabored. Lung sounds are clear and full.  CARDIOVASCULAR: Heart RRR,  No murmur or extra heart sounds  ARTERIAL: No carotid bruit. Carotid, Rt.Femoral, DP pulse 2+, left femoral, DP pulses 1+  VENOUS: No varicosities. No venous stasis skin changes  EDEMA:  No bilateral lower extremity edema.  GASTROINTESTINAL: Abdomen is soft, non-tender, not distended w/ normal bowel sounds. No hepatic or splenic enlargement. No mass, ventral or inguinal hernia. MUSCULOSKELETAL: Moves all extremities with full ROM, strength and tone. Back is without kyphosis, scoliosis or spinal process tenderness. Gait is steady w/ walker, requires some assist to rise from chair NEUROLOGIC: Not oriented to time, place, person.Speech clear, no tremor.  PSYCHIATRIC: Mood and affect appropriate to situation   ASSESSMENT/PLAN  ATRIAL FIBRILLATION, PAROXYSMAL Heart in regular rhythm today, rate improved since lowering the dose of Aricept.  DIASTOLIC DYSFUNCTION Weight remains stable between 178 -181lb, no lotion edema no respiratory problems. Activity status is stable  Alzheimer's disease Most recent MDS review October 2014: No significant change in cognition or functional status since last review  Thrombocytopenia, unspecified Chronic low platelet count, improved at last improved at last. No sign of bleeding  Vitreous hemorrhage Vision remains very low, is the source of a good portion of her debility    Follow up: Routine or as needed.   Jacoya Bauman T.Samreen Seltzer, NP-C 06/01/2013

## 2013-06-01 NOTE — Assessment & Plan Note (Signed)
Vision remains very low, is the source of a good portion of her debility

## 2013-06-01 NOTE — Assessment & Plan Note (Signed)
Most recent MDS review October 2014: No significant change in cognition or functional status since last review

## 2013-06-01 NOTE — Assessment & Plan Note (Signed)
Weight remains stable between 178 -181lb, no lotion edema no respiratory problems. Activity status is stable

## 2013-06-01 NOTE — Assessment & Plan Note (Signed)
Chronic low platelet count, improved at last improved at last. No sign of bleeding

## 2013-08-24 ENCOUNTER — Non-Acute Institutional Stay (SKILLED_NURSING_FACILITY): Payer: No Typology Code available for payment source | Admitting: Geriatric Medicine

## 2013-08-24 ENCOUNTER — Encounter: Payer: Self-pay | Admitting: Geriatric Medicine

## 2013-08-24 DIAGNOSIS — G309 Alzheimer's disease, unspecified: Secondary | ICD-10-CM

## 2013-08-24 DIAGNOSIS — D649 Anemia, unspecified: Secondary | ICD-10-CM

## 2013-08-24 DIAGNOSIS — I519 Heart disease, unspecified: Secondary | ICD-10-CM

## 2013-08-24 DIAGNOSIS — F0280 Dementia in other diseases classified elsewhere without behavioral disturbance: Secondary | ICD-10-CM

## 2013-08-24 DIAGNOSIS — I10 Essential (primary) hypertension: Secondary | ICD-10-CM

## 2013-08-24 DIAGNOSIS — D696 Thrombocytopenia, unspecified: Secondary | ICD-10-CM

## 2013-08-24 NOTE — Assessment & Plan Note (Signed)
Long-standing low platelet count, no sign of active bleeding. Update lab

## 2013-08-24 NOTE — Assessment & Plan Note (Signed)
Most recent MDS January 2015: BIMS 4/15, PHQ-9  3/27. The patient requires extensive assistance with most ADLs. She does continue to walk with her walker, requires supervision due to her poor vision. She continues to be frequently incontinent of bowel and bladder. Language skills remain intact, patient does not have a mood or behvior issues. She continues to participate in music activities and socialize with peers during meals. Patient has minimal family contact, continues to have a personal caregiver for companionship from time to time.

## 2013-08-24 NOTE — Assessment & Plan Note (Signed)
No signs of volume overload, activity status is stable. Continue current medications update lab

## 2013-08-24 NOTE — Assessment & Plan Note (Signed)
Chronic problem, update lab

## 2013-08-24 NOTE — Assessment & Plan Note (Signed)
Blood pressure range remains stable, continue current medications, update lab

## 2013-08-24 NOTE — Progress Notes (Signed)
Patient ID: Nicole Roach, female   DOB: 07/14/1922, 80 y.o.   MRN: [id number]  [facility] (31)  Code Status: Living well, HCPOA, DO NOT RESUSCITATE  Contact Information   Name Relation Home Work [place] Relative [phone]  [phone]       Chief Complaint  Patient presents with  . Medical Managment of Chronic Issues    AD, low vision    HPI: This is a 80 y.o. female resident of [place],  Skilled Nursing section evaluated today for management of ongoing medical issues.    Last visit: ATRIAL FIBRILLATION, PAROXYSMAL Heart in regular rhythm today, rate improved since lowering the dose of Aricept.  DIASTOLIC DYSFUNCTION Weight remains stable between 178 -181lb, no lotion edema no respiratory problems. Activity status is stable  Alzheimer's disease Most recent MDS review October 2014: No significant change in cognition or functional status since last review  Thrombocytopenia, unspecified Chronic low platelet count, improved at last improved at last. No sign of bleeding  Vitreous hemorrhage Vision remains very low, is the source of a good portion of her debility  Since last visit patient has not had any acute medical issues. Review of facility record shows vital signs have been stable, weight has been stable at 184 -185 pounds. She does not exhibit any signs of worsening heart failure. ,  Most recent MDS reviewed, reveals mild decrease in functional status, cognition is unchanged. Which does remain intact, the patient continued to participate in music activities. Short/Long-term memory loss remain major issue; she does not recall participation in activities, meals with peers or that she lives at [place]: Moderate Assist Bed Mobility: Extensive Assist Bladder Management: Diapers / Pads, Bowel Management: Diapers / Pads, Feeding: Supervision Hygiene and Grooming: Extensive  Assist, Toileting / Clothing: Extensive Assist Transfers: Extensive Assist, Walk: Supervision w/ walker     Allergies  Allergen Reactions  . Avelox [Moxifloxacin Hcl In Nacl]   . Levaquin [Levofloxacin In D5w]   . Quinolones   . Tetanus Toxoids    MEDICATION - reviewed   DATA REVIEWED  Radiologic Exams:    [facility] 07/04/2012 chest x-ray: No evidence of acute infiltrate is seen. No significant change from prior study of 12/14/2011   Cardiovascular Exams 09/2009 EKG: Atrial fibrillation, HR 73 BPM 11/03/09: 2D echo: LVEF 06-26%, diastolic dysfunction. Mild AoV stenosis. Mild MV regurgitation. RA w/ mild dilation. PA pressure mountain elevated.   Laboratory Studies:  [facility] 12/02/2012 WBC 2.4, hemoglobin 10.7, hematocrit 30.7, platelets 68  Glucose 93, BUN 24, creatinine 0.82, sodium 139, potassium 3.9. LFTs WNL. Total protein 5.4, albumin 3.3, calcium 8.1.  TSH 2.48   Vitamin B12 1301 03/17/2013 WBC 2.7, hemoglobin 10 2, hematocrit 31.0, platelets 80  Glucose 88, BUN 30, creatinine 1.02, sodium 139, potassium 3.8. Albumin 3.4. LFTs WNL.   REVIEW OF SYSTEMS DATA OBTAINED: from patient, nurse, medical record GENERAL: Feels "OK, I just sleep all the time "   No fevers, fatigue, change in appetite or weight SKIN: No itch, rash or open wounds EYES: No eye pain, dryness or itching  No change in vision (poor vision) EARS: No earache, change in hearing NOSE: No congestion, drainage or bleeding MOUTH/THROAT: No mouth or tooth pain  No sore throat   No difficulty chewing or swallowing RESPIRATORY: No cough, wheezing, SOB CARDIAC: No chest pain, palpitations  No edema. CHEST/BREASTS: No discomfort, discharge or lumps in breasts GI: No abdominal  pain  No N/V/D or constipation  No heartburn or reflux   Frequent incontinence GU: No dysuria, frequency or urgency  No change in urine volume or character  Frequent incontinence  MUSCULOSKELETAL: No joint pain, swelling or  stiffness  No back pain  No muscle ache, pain, weakness  Gait is steady w/ walker  No recent falls.  NEUROLOGIC: No dizziness, fainting, headache No change in mental status (dementia).  PSYCHIATRIC: No signs of anxiety, depression Sleeps well.  No behavior issue.    PHYSICAL EXAM  Filed Vitals:   08/24/13 1211  BP: 143/73  Pulse: 62  Temp: 97 F (36.1 C)  Resp: 19  Weight: 185 lb 12.8 oz (84.278 kg)  SpO2: 94%   Body mass index is 29.09 kg/(m^2).  GENERAL APPEARANCE: No acute distress, appropriately groomed, normal body habitus. Alert, pleasant, conversant. SKIN: No diaphoresis, rash, unusual lesions, wounds. Lipoma rt. Upper back unchnaged HEAD: Normocephalic, atraumatic EYES: Conjunctiva/lids clear. Pupils small, round, patient reports light is brighter in her right eye  EARS: External exam WNL,  bilateral canals with some cerumen Hearing decreased, not new. NOSE: No deformity or discharge. MOUTH/THROAT: Lips w/o lesions. Oral mucosa, tongue moist, w/o lesion. Oropharynx w/o redness or lesions.  NECK: Supple, full ROM. No thyroid tenderness, enlargement or nodule LYMPHATICS: No head, neck or supraclavicular adenopathy RESPIRATORY: Breathing is even, unlabored. Lung sounds are clear and full.  CARDIOVASCULAR: Heart RRR,  No murmur or extra heart sounds  ARTERIAL: No carotid bruit. Carotid, Rt.Femoral, DP pulse 2+, left femoral, DP pulses 1+  VENOUS: No varicosities. No venous stasis skin changes  EDEMA:  No bilateral lower extremity edema.  GASTROINTESTINAL: Abdomen is soft, non-tender, not distended w/ normal bowel sounds. No hepatic or splenic enlargement. No mass, ventral or inguinal hernia. MUSCULOSKELETAL: Moves all extremities with full ROM, strength and tone. Back is without kyphosis, scoliosis or spinal process tenderness. Gait is steady w/ walker, requires some assist to rise from chair NEUROLOGIC: Not oriented to time, place, person.Speech clear, no tremor.  PSYCHIATRIC:  Mood and affect appropriate to situation   ASSESSMENT/PLAN  HYPERTENSION Blood pressure range remains stable, continue current medications, update lab  DIASTOLIC DYSFUNCTION No signs of volume overload, activity status is stable. Continue current medications update lab  Alzheimer's disease Most recent MDS January 2015: BIMS 4/15, PHQ-9  3/27. The patient requires extensive assistance with most ADLs. She does continue to walk with her walker, requires supervision due to her poor vision. She continues to be frequently incontinent of bowel and bladder. Language skills remain intact, patient does not have a mood or behvior issues. She continues to participate in music activities and socialize with peers during meals. Patient has minimal family contact, continues to have a personal caregiver for companionship from time to time.  Thrombocytopenia, unspecified Long-standing low platelet count, no sign of active bleeding. Update lab  ANEMIA Chronic problem, update lab    Follow up: Routine or as needed.  Lack 08/25/2013: CBC, CMP, TSH   Shawnise Peterkin T.Wladyslaw Henrichs, NP-C 08/24/2013

## 2013-08-25 LAB — BASIC METABOLIC PANEL
BUN: 36 mg/dL — AB (ref 4–21)
CREATININE: 0.8 mg/dL (ref <=1.1)
Glucose: 94 mg/dL
POTASSIUM: 3.9 mmol/L (ref 3.4–5.3)
Sodium: 142 mmol/L (ref 137–147)

## 2013-08-25 LAB — TSH: TSH: 1.88 u[IU]/mL (ref 0.41–5.90)

## 2013-08-25 LAB — CBC AND DIFFERENTIAL
HCT: 29 % — AB (ref 36–46)
Hemoglobin: 9.9 g/dL — AB (ref 12.0–16.0)
PLATELETS: 60 10*3/uL — AB (ref 150–399)
WBC: 2.4 10*3/mL

## 2013-10-19 ENCOUNTER — Non-Acute Institutional Stay (SKILLED_NURSING_FACILITY): Payer: No Typology Code available for payment source | Admitting: Adult Health

## 2013-10-19 ENCOUNTER — Encounter: Payer: Self-pay | Admitting: Adult Health

## 2013-10-19 DIAGNOSIS — I4891 Unspecified atrial fibrillation: Secondary | ICD-10-CM

## 2013-10-19 DIAGNOSIS — I10 Essential (primary) hypertension: Secondary | ICD-10-CM

## 2013-10-19 DIAGNOSIS — G309 Alzheimer's disease, unspecified: Secondary | ICD-10-CM

## 2013-10-19 DIAGNOSIS — F0280 Dementia in other diseases classified elsewhere without behavioral disturbance: Secondary | ICD-10-CM

## 2013-10-19 DIAGNOSIS — D649 Anemia, unspecified: Secondary | ICD-10-CM

## 2013-10-19 DIAGNOSIS — I519 Heart disease, unspecified: Secondary | ICD-10-CM

## 2013-10-19 DIAGNOSIS — H3530 Unspecified macular degeneration: Secondary | ICD-10-CM

## 2013-10-19 NOTE — Progress Notes (Signed)
Patient ID: Nicole Roach, female   DOB: [birth year], 80 y.o.   MRN: [id number]   [facility] (31)   Code status: Living will, HCPOA, DNR  Contact Information   Name Relation Home Work [place] Relative [phone]  [phone]       Chief Complaint  Patient presents with  . Medical Managment of Chronic Issues  . Dementia  . Hypertension    Nicole Roach is a 80 y.o. female resident of [place], Skilled Nursing section evaluated today for management of ongoing medical issues. She is slightly more anemic with hgb from 10.2 last year to 9.9 per labs 08/25/13. She is not receiving any treatment. No associated symptoms. No reported changes concerns per nursing   Last visit 2/2 ASSESSMENT/PLAN  HYPERTENSION  Blood pressure range remains stable, continue current medications, update lab  DIASTOLIC DYSFUNCTION  No signs of volume overload, activity status is stable. Continue current medications update lab  Alzheimer's disease  Most recent MDS January 2015: BIMS 4/15, PHQ-9 3/27. The patient requires extensive assistance with most ADLs. She does continue to walk with her walker, requires supervision due to her poor vision. She continues to be frequently incontinent of bowel and bladder. Language skills remain intact, patient does not have a mood or behvior issues. She continues to participate in music activities and socialize with peers during meals. Patient has minimal family contact, continues to have a personal caregiver for companionship from time to time.  Thrombocytopenia, unspecified  Long-standing low platelet count, no sign of active bleeding. Update lab  ANEMIA  Chronic problem, update lab        Allergies  Allergen Reactions  . Avelox [Moxifloxacin Hcl In Nacl]   . Levaquin [Levofloxacin In D5w]   . Quinolones   . Tetanus Toxoids      DATA REVIEWED  Laboratory Studies: Lab Results  Component Value Date   WBC 2.4 08/25/2013   HGB 9.9* 08/25/2013   HCT 29* 08/25/2013   MCV 101.3* 11/23/2009   PLT 60* 08/25/2013   Lab Results  Component Value Date   NA 142 08/25/2013   K 3.9 08/25/2013   GLU 94 08/25/2013   BUN 36* 08/25/2013   CREATININE 0.8 08/25/2013    Lab Results  Component Value Date   TSH 1.88 08/25/2013       REVIEW OF SYSTEMS  DATA OBTAINED: from patient, nurse, medical record  GENERAL: Feels "OK, I just sleep all the time " No fevers, fatigue, change in appetite or weight   EYES: No eye pain, dryness or itching No change in vision (poor vision)   MOUTH/THROAT: No mouth or tooth pain No sore throat No difficulty chewing or swallowing  RESPIRATORY: No cough, wheezing, SOB  CARDIAC: No chest pain, palpitations No edema.  GI: No abdominal pain No N/V/D or constipation No heartburn or reflux. Daily BM Frequent incontinence  GU: No dysuria, frequency or urgency No change in urine volume or character Frequent incontinence  MUSCULOSKELETAL: No joint pain, swelling or stiffness No back pain No muscle ache, pain, weakness Gait is steady w/ walker No recent falls.  NEUROLOGIC: No dizziness, fainting, headache No change in mental status (dementia).  PSYCHIATRIC: No signs of anxiety, depression Sleeps well. No behavior issue.    PHYSICAL EXAM Filed Vitals:   10/19/13 1209  BP: 136/60  Pulse: 68  Temp: 97.4 F (36.3 C)  Resp: 18  Weight: 187 lb 6.4 oz (85.004 kg)  SpO2: 95%  Body mass index is 29.34 kg/(m^2).  GENERAL APPEARANCE: No acute distress, appropriately groomed, normal body habitus. Alert, pleasant, conversant.  HEAD: Normocephalic, atraumatic  EYES: Conjunctiva/lids clear. Pupils small, round, patient reports light is brighter in her right eye. Asked me to turn out overhead light  EARS: External exam WNL  NOSE: No deformity or discharge.  MOUTH/THROAT: Lips w/o lesions. Oral mucosa, tongue moist, w/o lesion. Oropharynx w/o redness or lesions.   RESPIRATORY: Breathing is even,  unlabored. Lung sounds are clear and full.  CARDIOVASCULAR: Heart RRR, No murmur or extra heart sounds   ARTERIAL: No carotid bruit. Carotid,  DP pulses 1+, Bilateral LE edema 1+   VENOUS: No varicosities. No venous stasis skin changes   GASTROINTESTINAL: Abdomen is soft, non-tender, not distended w/ normal bowel sounds. No hepatic or splenic enlargement. No mass, ventral or inguinal hernia.  MUSCULOSKELETAL: Moves all extremities with full ROM, strength and tone. Back is without kyphosis, scoliosis or spinal process tenderness. Gait is steady w/ walker, requires some assist to rise from chair  NEUROLOGIC: Not oriented to time, place, person.Speech clear, no tremor. Advanced STM loss, dementia  PSYCHIATRIC: Mood and affect appropriate to situation     ASSESSMENT/PLAN  ATRIAL FIBRILLATION, PAROXYSMAL Regular rate and rhythm today. Continue current care plan. follow  DIASTOLIC DYSFUNCTION Mild bilateral LE edema, non pitting. Lungs clear. No sxs fluid overload. Continue current care plan  HYPERTENSION Blood pressure remains in acceptable range for age. Continue current medications  Alzheimer's disease Requires extensive assistance with adls. She has steady gait with walker. Human assistance/supervision required due to poor judgement and significant short term memory deficit. Does not know where she is. She is pleasant and conversant. SHe understands questions, but does not remember answers to questions. Enjoys activites. Incontinent frequently of bowel and bladder  ANEMIA Chronic anemia. No sxs bleeding. No sxs anemia is affecting activity level. Most recent hgb 9.9 08/25/13, down from 10.2 a year ago.  MACULAR DEGENERATION Stable. Very poor vision left eye per usual. Can see large print with right eye. Was able to read the "Push for Help" button on her bed.    Labs/tests ordered:    Follow up: Return for Routine/as needed.  Nicole Ace T.Albie Bazin, NP-C/Nicole Roach  student [employer] [phone]  10/19/2013

## 2013-10-19 NOTE — Assessment & Plan Note (Signed)
Chronic anemia. No sxs bleeding. No sxs anemia is affecting activity level. Most recent hgb 9.9 08/25/13, down from 10.2 a year ago.

## 2013-10-19 NOTE — Assessment & Plan Note (Signed)
Requires extensive assistance with adls. She has steady gait with walker. Human assistance/supervision required due to poor judgement and significant short term memory deficit. Does not know where she is. She is pleasant and conversant. SHe understands questions, but does not remember answers to questions. Enjoys activites. Incontinent frequently of bowel and bladder

## 2013-10-19 NOTE — Assessment & Plan Note (Signed)
Blood pressure remains in acceptable range for age. Continue current medications

## 2013-10-19 NOTE — Assessment & Plan Note (Signed)
Regular rate and rhythm today. Continue current care plan. follow

## 2013-10-19 NOTE — Assessment & Plan Note (Signed)
Mild bilateral LE edema, non pitting. Lungs clear. No sxs fluid overload. Continue current care plan

## 2013-10-19 NOTE — Assessment & Plan Note (Signed)
Stable. Very poor vision left eye per usual. Can see large print with right eye. Was able to read the "Push for Help" button on her bed.

## 2013-10-22 ENCOUNTER — Non-Acute Institutional Stay (SKILLED_NURSING_FACILITY): Payer: No Typology Code available for payment source | Admitting: Geriatric Medicine

## 2013-10-22 ENCOUNTER — Encounter: Payer: Self-pay | Admitting: Geriatric Medicine

## 2013-10-22 DIAGNOSIS — R635 Abnormal weight gain: Secondary | ICD-10-CM

## 2013-10-22 LAB — BASIC METABOLIC PANEL
BUN: 25 mg/dL — AB (ref 4–21)
CREATININE: 0.7 mg/dL (ref 0.5–1.1)
GLUCOSE: 93 mg/dL
POTASSIUM: 4.2 mmol/L (ref 3.4–5.3)
SODIUM: 139 mmol/L (ref 137–147)

## 2013-10-22 LAB — CBC AND DIFFERENTIAL
HCT: 32 % — AB (ref 36–46)
HEMOGLOBIN: 10.8 g/dL — AB (ref 12.0–16.0)
Platelets: 73 10*3/uL — AB (ref 150–399)
WBC: 2.7 10*3/mL

## 2013-10-22 NOTE — Progress Notes (Signed)
Patient ID: HILDRETH ORSAK, female   DOB: 15-Oct-1921, 80 y.o.   MRN: [id number]   [facility] [phone])   Contact Information   Name [place] Relative [phone]  [phone]       Chief Complaint  Patient presents with  . Weight Gain    HPI: This is a 80 y.o. female resident of [facility]  section.  Evaluation is requested today due to weight gain;  6 pounds in the last week. No obvious edema at nurse does report patient has decreased activity status, get short of breath with walking in the hallways.     Allergies  Allergen Reactions  . Avelox [Moxifloxacin Hcl In Nacl]   . Levaquin [Levofloxacin In D5w]   . Quinolones   . Tetanus Toxoids     MEDICATIONS -  Reviewed  DATA REVIEWED  Radiologic Exams:   Laboratory Studies Lab Results  Component Value Date   WBC 2.4 08/25/2013   HGB 9.9* 08/25/2013   HCT 29* 08/25/2013   MCV 101.3* 11/23/2009   PLT 60* 08/25/2013   Lab Results  Component Value Date   NA 142 08/25/2013   K 3.9 08/25/2013   GLU 94 08/25/2013   BUN 36* 08/25/2013   CREATININE 0.8 08/25/2013     REVIEW OF SYSTEMS  DATA OBTAINED: from patient, nurse, medical record GENERAL: "I'm OK, just tired, not hungry "  No recent fever. Recent decreased activity status, appetite, increased weight RESPIRATORY: No cough, wheezing. Nurse reports patient apears short of breath with moderate ambulation CARDIAC: No chest pain, palpitations. Chronic lower extremity edema GI: No abdominal pain  No Nausea,vomiting,diarrhea or constipation  No heartburn or reflux  MUSCULOSKELETAL: No joint pain, swelling or stiffness   No back pain    No muscle ache, pain, weakness    Gait is steady    No recent falls  NEUROLOGIC: No dizziness, fainting, headache   No change in mental status  (dementia) PSYCHIATRIC: No signs of increased anxiety, depression  Sleeps well   No behavior issue   PHYSICAL  EXAM Filed Vitals:   10/22/13 1337  Weight: 193 lb 6.4 oz (87.726 kg)   Body mass index is 30.28 kg/(m^2).  GENERAL APPEARANCE: No acute distress, appropriately groomed, normal body habitus Alert, pleasant, conversant. SKIN: No diaphoresis, rash, wound HEAD: Normocephalic, atraumatic EYES: Conjunctiva/lids clear  RESPIRATORY: Breathing is even, unlabored  Lung sounds are clear and full  CARDIOVASCULAR: Heart RRR   No murmur or extra heart sounds   EDEMA: Trace bilateral lower extremity edema, chronic. No worse than usual MUSCULOSKELETAL.  Gait is not assessed today  PSYCHIATRIC: Mood and affect appropriate to situation    ASSESSMENT/PLAN  Weight gain Patient with history of diastolic heart failure having 6 pound weight gain in the last week. Other subtle signs include decreased activity tolerance, shortness of breath with moderate ambulation, decreased appetite. Obtain CBC, BMP and BNP today. Increase Lasix to b.i.d. dosing for 3 days    Family/ staff Communication:     Labs/tests ordered: CBC,BMP, BNP   Follow up: Return for As needed.  Mardene Celeste, NP-C [place] [phone]  10/22/2013

## 2013-10-23 ENCOUNTER — Encounter: Payer: Self-pay | Admitting: Geriatric Medicine

## 2013-10-23 DIAGNOSIS — R635 Abnormal weight gain: Secondary | ICD-10-CM | POA: Insufficient documentation

## 2013-10-23 NOTE — Assessment & Plan Note (Signed)
Patient with history of diastolic heart failure having 6 pound weight gain in the last week. Other subtle signs include decreased activity tolerance, shortness of breath with moderate ambulation, decreased appetite. Obtain CBC, BMP and BNP today. Increase Lasix to b.i.d. dosing for 3 days

## 2013-11-02 ENCOUNTER — Non-Acute Institutional Stay (SKILLED_NURSING_FACILITY): Payer: No Typology Code available for payment source | Admitting: Geriatric Medicine

## 2013-11-02 ENCOUNTER — Encounter: Payer: Self-pay | Admitting: Geriatric Medicine

## 2013-11-02 DIAGNOSIS — I519 Heart disease, unspecified: Secondary | ICD-10-CM

## 2013-11-02 DIAGNOSIS — D696 Thrombocytopenia, unspecified: Secondary | ICD-10-CM

## 2013-11-02 DIAGNOSIS — D649 Anemia, unspecified: Secondary | ICD-10-CM

## 2013-11-02 MED ORDER — METOPROLOL SUCCINATE ER 25 MG PO TB24: 25.0000 mg | ORAL_TABLET | Freq: Every day | ORAL | Status: DC

## 2013-11-02 NOTE — Progress Notes (Signed)
Patient ID: Nicole Roach, female   DOB: 25-May-1922, 80 y.o.   MRN: [id number]   [facility] [phone])   Contact Information   Name [place] Relative [phone]  [phone]       Chief Complaint  Patient presents with  . Weight Gain  . Leg Swelling    HPI: This is a 80 y.o. female resident of [place]  section.  Evaluated today in followup of weight gain and lower extremity edema.   Last visit: Weight gain Patient with history of diastolic heart failure having 6 pound weight gain in the last week. Other subtle signs include decreased activity tolerance, shortness of breath with moderate ambulation, decreased appetite. Obtain CBC, BMP and BNP today. Increase Lasix to b.i.d. dosing for 3 days  Since last visit, patient has had further fluctuation in her weight. She did respond to 3 day icreased dose of diuretic with a several pound weight loss. Weight then increased again along with lower extremity edema. Laboratory studies April 2 were satisfactory. The BNP was only mildly elevated. Diuretic dose was increased to 60 mg daily several days ago, patient's weight has come down 3 pounds. There is no documentation in last few days regarding her activity tolerance   Allergies  Allergen Reactions  . Avelox [Moxifloxacin Hcl In Nacl]   . Levaquin [Levofloxacin In D5w]   . Quinolones   . Tetanus Toxoids     MEDICATIONS -  Reviewed  DATA REVIEWED  Radiologic Exams:   Laboratory Studies Lab Results  Component Value Date   WBC 2.4 08/25/2013   HGB 9.9* 08/25/2013   HCT 29* 08/25/2013   MCV 101.3* 11/23/2009   PLT 60* 08/25/2013   Lab Results  Component Value Date   NA 142 08/25/2013   K 3.9 08/25/2013   GLU 94 08/25/2013   BUN 36* 08/25/2013   CREATININE 0.8 08/25/2013   Lab Results  Component Value Date   WBC 2.7 10/22/2013   HGB 10.8* 10/22/2013   HCT 32* 10/22/2013   MCV 101.3* 11/23/2009   PLT 73* 10/22/2013   Lab Results  Component Value Date   NA 139 10/22/2013   K 4.2 10/22/2013   GLU 93 10/22/2013   BUN 25* 10/22/2013   CREATININE 0.7 10/22/2013   BNP  135.7      10/22/2013    REVIEW OF SYSTEMS  DATA OBTAINED: from patient, nurse, medical record GENERAL: "I'm OK, just tired, not hungry "  No recent fever. Recent decreased activity status, appetite, increased weight  RESPIRATORY: No cough, wheezing.  CARDIAC: No chest pain, palpitations. Chronic lower extremity edema GI: No abdominal pain  No Nausea,vomiting,diarrhea or constipation  No heartburn or reflux  MUSCULOSKELETAL: No joint pain, swelling or stiffness   No back pain    No muscle ache, pain, weakness    Gait is steady    No recent falls  NEUROLOGIC: No dizziness, fainting, headache   No change in mental status  (dementia) PSYCHIATRIC: No signs of increased anxiety, depression  Sleeps well   No behavior issue   PHYSICAL EXAM Filed Vitals:   11/02/13 1638  BP: 124/64  Pulse: 57  Temp: 97.5 F (36.4 C)  Resp: 20  Weight: 188 lb 8 oz (85.503 kg)  SpO2: 93%   Body mass index is 29.52 kg/(m^2).  GENERAL APPEARANCE: No acute distress, appropriately groomed, Overweight body habitus Alert, pleasant, conversant. SKIN: No diaphoresis, rash, wound HEAD:  Normocephalic, atraumatic EYES: Conjunctiva/lids clear  RESPIRATORY: Breathing is even, unlabored  Lung sounds are clear and full  CARDIOVASCULAR: Heart RRR   No murmur or extra heart sounds   EDEMA: TNo bilateral lower extremity edema MUSCULOSKELETAL.  Gait is not assessed today  PSYCHIATRIC: Mood and affect appropriate to situation    ASSESSMENT/PLAN  DIASTOLIC DYSFUNCTION Subtle signs of worsening diastolic dysfunction last week; weight gain, lower extremity edema, activity intolerance. Weight has improved with increased dose of diuretic.  BP has be stable, HR has been a bit lower than usual lately. Beta blocker in addition to the diuretic may be a better choice at  this time.   ANEMIA Hemoglobin and hematocrit with mild improvement since last measured.  Thrombocytopenia, unspecified Chronic problem, most recent platelets mildly improved. No sign of bleeding.    Family/ staff Communication:     Labs/tests ordered:  11/03/13 BMP   Follow up: Return for Routine/as needed.  Mardene Celeste, NP-C [place] [phone]  11/02/2013

## 2013-11-02 NOTE — Assessment & Plan Note (Signed)
Chronic problem, most recent platelets mildly improved. No sign of bleeding.  

## 2013-11-02 NOTE — Assessment & Plan Note (Signed)
Subtle signs of worsening diastolic dysfunction last week; weight gain, lower extremity edema, activity intolerance. Weight has improved with increased dose of diuretic.  BP has be stable, HR has been a bit lower than usual lately. Beta blocker in addition to the diuretic may be a better choice at this time.

## 2013-11-02 NOTE — Assessment & Plan Note (Signed)
Hemoglobin and hematocrit with mild improvement since last measured.

## 2013-12-04 ENCOUNTER — Encounter: Payer: Self-pay | Admitting: Adult Health

## 2013-12-04 ENCOUNTER — Non-Acute Institutional Stay (SKILLED_NURSING_FACILITY): Payer: No Typology Code available for payment source | Admitting: Adult Health

## 2013-12-04 DIAGNOSIS — F0280 Dementia in other diseases classified elsewhere without behavioral disturbance: Secondary | ICD-10-CM

## 2013-12-04 DIAGNOSIS — G309 Alzheimer's disease, unspecified: Secondary | ICD-10-CM

## 2013-12-04 DIAGNOSIS — I519 Heart disease, unspecified: Secondary | ICD-10-CM

## 2013-12-04 DIAGNOSIS — I4891 Unspecified atrial fibrillation: Secondary | ICD-10-CM

## 2013-12-04 DIAGNOSIS — D696 Thrombocytopenia, unspecified: Secondary | ICD-10-CM

## 2013-12-04 DIAGNOSIS — D649 Anemia, unspecified: Secondary | ICD-10-CM

## 2013-12-04 DIAGNOSIS — I10 Essential (primary) hypertension: Secondary | ICD-10-CM

## 2013-12-04 NOTE — Assessment & Plan Note (Signed)
Her weight has been stable over the past month and there are no s/s of exacerbation. Continue BB and Lasix. Recheck BMP secondary elevated BUN and sodium

## 2013-12-04 NOTE — Assessment & Plan Note (Signed)
BP acceptable on current meds. Continue and monitor.

## 2013-12-04 NOTE — Assessment & Plan Note (Signed)
Requires extensive assistance with adls. She has steady gait with walker. Human assistance/supervision required due to poor judgement and significant short term memory deficit. Does not know where she is. She is pleasant and conversant. SHe understands questions, but does not remember answers to questions. Enjoys activites. Incontinent frequently of bowel and bladder. Continue Aricept and Namenda.

## 2013-12-04 NOTE — Assessment & Plan Note (Signed)
Chronic problem, most recent platelets mildly improved. No sign of bleeding.

## 2013-12-04 NOTE — Assessment & Plan Note (Signed)
Last H/H improved. Continue to monitor

## 2013-12-04 NOTE — Assessment & Plan Note (Signed)
Irregular rhythm today but good rate control. Continue BB. Low plts so no further tx

## 2013-12-04 NOTE — Progress Notes (Signed)
Patient ID: Nicole Roach, female   DOB: 02-13-1922, 80 y.o.   MRN: [id number]   [facility] (31)   Code status: Living will, HCPOA, DNR  Contact Information   Name Relation Home Work [place] Relative [phone]  [phone]       Chief Complaint  Patient presents with  . Medical Management of Chronic Issues    KXF:GHWE is a 80 y.o. female resident of [place], Skilled Nursing section evaluated today for management of ongoing medical issues. Her weight has remained stable. There are no new issues today. She was treated for worsening diastolic dysfxn last week and this has improved.   Last visit ATRIAL FIBRILLATION, PAROXYSMAL Irregular rate today. On BB. No on anticoagulation secondary thrombocytopenia  HYPERTENSION BP range 113-136/57-66 over the past month. Currently on BB.   Alzheimer's disease Requires extensive assistance with adls. She has steady gait with walker. Human assistance/supervision required due to poor judgement and significant short term memory deficit. Does not know where she is. She is pleasant and conversant. SHe understands questions, but does not remember answers to questions. Enjoys activites. Incontinent frequently of bowel and bladder  DIASTOLIC DYSFUNCTION Her weight has remained stable over the past month 187-189lbs. She has had no SOB,Cp,  DOE, or increased edema. Currently on BB and Lasix.  ANEMIA Hemoglobin and hematocrit with mild improvement since last measured.  Thrombocytopenia, unspecified Chronic problem, most recent platelets mildly improved. No sign of bleeding.       Allergies  Allergen Reactions  . Avelox [Moxifloxacin Hcl In Nacl]   . Levaquin [Levofloxacin In D5w]   . Quinolones   . Tetanus Toxoids      DATA REVIEWED  Laboratory Studies: Lab Results  Component Value Date   WBC 2.7 10/22/2013   HGB 10.8* 10/22/2013   HCT 32* 10/22/2013   MCV 101.3*  11/23/2009   PLT 73* 10/22/2013   Lab Results  Component Value Date   NA 139 10/22/2013   K 4.2 10/22/2013   GLU 93 10/22/2013   BUN 25* 10/22/2013   CREATININE 0.7 10/22/2013    Lab Results  Component Value Date   TSH 1.88 08/25/2013       REVIEW OF SYSTEMS  DATA OBTAINED: from patient, nurse, medical record  GENERAL:No fevers, fatigue, change in appetite or weight   EYES: No eye pain, dryness or itching No change in vision (poor vision)   MOUTH/THROAT: No mouth or tooth pain No sore throat No difficulty chewing or swallowing  RESPIRATORY: No cough, wheezing, SOB  CARDIAC: No chest pain, palpitations. Lower ext edema (chronic) GI: No abdominal pain No N/V/D or constipation No heartburn or reflux. Daily BM Frequent incontinence  GU: No dysuria, frequency or urgency No change in urine volume or character Frequent incontinence  MUSCULOSKELETAL: No joint pain, swelling or stiffness No back pain No muscle ache, pain, weakness Gait is steady w/ walker No recent falls.  NEUROLOGIC: No dizziness, fainting, headache No change in mental status (dementia).  PSYCHIATRIC: No signs of anxiety, depression Sleeps well. No behavior issue.    PHYSICAL EXAM Filed Vitals:   12/04/13 1354  BP: 113/66  Pulse: 65  Temp: 97 F (36.1 C)  Resp: 16  Weight: 188 lb 12.8 oz (85.639 kg)  SpO2: 92%   Body mass index is 29.56 kg/(m^2).  GENERAL APPEARANCE: No acute distress, appropriately groomed, normal body habitus. Alert, pleasant, conversant.  HEAD: Normocephalic, atraumatic  EYES: PERRL NOSE: No deformity  or discharge.  MOUTH/THROAT: Lips w/o lesions. Oral mucosa, tongue moist, w/o lesion. Oropharynx w/o redness or lesions.   RESPIRATORY: Breathing is even, unlabored. Lung sounds are clear and full.  CARDIOVASCULAR: Heart irregular No murmur or extra heart sounds   ARTERIAL: No carotid bruit. Carotid,  DP pulses 1+, Bilateral LE edema 1+   VENOUS: No varicosities. No venous stasis skin changes    GASTROINTESTINAL: Abdomen is soft, non-tender, not distended w/ normal bowel sounds. No hepatic or splenic enlargement. No mass, ventral or inguinal hernia.  MUSCULOSKELETAL: Moves all extremities with full ROM, strength and tone. No spinal process tenderness. Gait is steady w/ walker, requires some assist to rise from chair  NEUROLOGIC: Not oriented to time, place, person.Speech clear, no tremor. Advanced STM loss, dementia  PSYCHIATRIC: Mood and affect appropriate to situation     ASSESSMENT/PLAN  HYPERTENSION BP acceptable on current meds. Continue and monitor.  ATRIAL FIBRILLATION, PAROXYSMAL Irregular rhythm today but good rate control. Continue BB. Low plts so no further tx  Alzheimer's disease Requires extensive assistance with adls. She has steady gait with walker. Human assistance/supervision required due to poor judgement and significant short term memory deficit. Does not know where she is. She is pleasant and conversant. SHe understands questions, but does not remember answers to questions. Enjoys activites. Incontinent frequently of bowel and bladder. Continue Aricept and Namenda.   Thrombocytopenia, unspecified Chronic problem, most recent platelets mildly improved. No sign of bleeding.   ANEMIA Last H/H improved. Continue to monitor  DIASTOLIC DYSFUNCTION Her weight has been stable over the past month and there are no s/s of exacerbation. Continue BB and Lasix. Recheck BMP secondary elevated BUN and sodium     Labs/tests ordered: BMP   Follow up: No Follow-up on file.  Heinrich Fertig T.Grayland Daisey, NP-C/[employer] executive, MSN [facility] [phone]  12/04/2013

## 2013-12-08 LAB — BASIC METABOLIC PANEL
BUN: 29 mg/dL — AB (ref 4–21)
Creatinine: 0.8 mg/dL (ref 0.5–1.1)
Glucose: 82 mg/dL
Potassium: 3.8 mmol/L (ref 3.4–5.3)
Sodium: 143 mmol/L (ref 137–147)

## 2014-01-26 ENCOUNTER — Non-Acute Institutional Stay (SKILLED_NURSING_FACILITY): Payer: No Typology Code available for payment source | Admitting: Nurse Practitioner

## 2014-01-26 DIAGNOSIS — I482 Chronic atrial fibrillation, unspecified: Secondary | ICD-10-CM

## 2014-01-26 DIAGNOSIS — I10 Essential (primary) hypertension: Secondary | ICD-10-CM

## 2014-01-26 DIAGNOSIS — D649 Anemia, unspecified: Secondary | ICD-10-CM

## 2014-01-26 DIAGNOSIS — I519 Heart disease, unspecified: Secondary | ICD-10-CM

## 2014-01-26 DIAGNOSIS — F0280 Dementia in other diseases classified elsewhere without behavioral disturbance: Secondary | ICD-10-CM

## 2014-01-26 DIAGNOSIS — I4891 Unspecified atrial fibrillation: Secondary | ICD-10-CM

## 2014-01-26 DIAGNOSIS — G309 Alzheimer's disease, unspecified: Secondary | ICD-10-CM

## 2014-01-26 NOTE — Progress Notes (Signed)
Patient ID: Nicole Roach, female   DOB: 1922/03/25, 80 y.o.   MRN: [id number]    Nursing Home Location:  [place]   Place of Service: SNF (31)  PCP: Estill Dooms, MD  Allergies  Allergen Reactions  . Avelox [Moxifloxacin Hcl In Nacl]   . Levaquin [Levofloxacin In D5w]   . Quinolones   . Tetanus Toxoids     Chief Complaint  Patient presents with  . Medical Management of Chronic Issues    HPI:  This is a 80 y.o. female resident of [place], Skilled Nursing section evaluated today for management of ongoing medical issues. Pt has remained stable in the last month and there are no new issues today.  Review of Systems:  DATA OBTAINED: from patient, nurse, medical record   GENERAL:No fevers, fatigue, change in appetite or weight    MOUTH/THROAT: No mouth or tooth pain No sore throat No difficulty chewing or swallowing   RESPIRATORY: No cough, wheezing, SOB   CARDIAC: No chest pain, palpitations. Lower ext edema (chronic) GI: No abdominal pain No N/V/D or constipation No heartburn or reflux.  incontinence of bowel GU: No dysuria, frequency or urgency No change in urine volume or character Frequent incontinence of bladder MUSCULOSKELETAL: No joint pain, swelling or stiffness No back pain No muscle ache, pain, weakness Gait is steady w/ walker No recent falls.   NEUROLOGIC: No dizziness, fainting, headache No change in mental status (dementia).  PSYCHIATRIC: No signs of anxiety, depression Sleeps well. No behavior issue.    Past Medical History  Diagnosis Date  . Alzheimer's disease 12/01/2012  . Osteoarthrosis, unspecified whether generalized or localized, unspecified site 1999  . Unspecified essential hypertension 2000  . Macular degeneration (senile) of retina, unspecified 2005    Left eye, status post posterior vitrectomy  . Atrial fibrillation 2008    warfarin DC 2011 due to to anemia  . Unspecified vitamin D deficiency 2009    . Vitreous hemorrhage 2011  . Depressive disorder, not elsewhere classified 2011  . Anemia, unspecified 2011  . Thrombocytopenia, unspecified 2011  . Pneumonia, organism unspecified 2011  . Debility, unspecified 2011  . Diastolic heart failure 2376    2D echocardiogram w/ wevidence of diastolic dysfunction  . Primary pulmonary hypertension 2011  . Lipoma of other skin and subcutaneous tissue 2011    large lipoma rt. mid back  . Leukocytopenia, unspecified 2011  . Impacted cerumen 2011  . Conjunctivitis unspecified 2011  . Ganglion and cyst of synovium, tendon and bursa 2012  . Other B-complex deficiencies 2012  . Unspecified constipation 2013  . Loss of weight 05/2012   Past Surgical History  Procedure Laterality Date  . Tonsillectomy and adenoidectomy  1927  . Thyroidectomy, partial  1950s  . Total hip arthroplasty Right 1999  . Cataract extraction w/ intraocular lens  implant, bilateral Bilateral 2002  . Vitrectomy Left 2005    Posterior vitrectomy and repair  . Vitrectomy Left 2/ 2011   Social History:   reports that she has quit smoking. Her smoking use included Cigarettes. She smoked 0.00 packs per day for 30 years. She does not have any smokeless tobacco history on file. She reports that she does not drink alcohol. Her drug history is not on file.  No family history on file.  Medications: Patient's Medications  New Prescriptions   No medications on file  Previous Medications   BRIMONIDINE-TIMOLOL (COMBIGAN) 0.2-0.5 % OPHTHALMIC SOLUTION    Place 1 drop  into the left eye 2 (two) times daily.   CHLORHEXIDINE (PERIDEX) 0.12 % SOLUTION    Use as directed 15 mLs in the mouth or throat 2 (two) times daily.   CHOLECALCIFEROL (VITAMIN D) 2000 UNITS TABLET    Take 2,000 Units by mouth daily.   DONEPEZIL (ARICEPT) 10 MG TABLET    Take 5 mg by mouth at bedtime.    FUROSEMIDE (LASIX) 40 MG TABLET    Take 60 mg by mouth daily.    MEMANTINE HCL ER (NAMENDA XR) 28 MG CP24    Take  1 capsule by mouth daily.   METOPROLOL SUCCINATE (TOPROL-XL) 25 MG 24 HR TABLET    Take 1 tablet (25 mg total) by mouth daily.   MULTIPLE VITAMINS-MINERALS (PRESERVISION AREDS PO)    Take 1 capsule by mouth daily.   NAPROXEN SODIUM (ANAPROX) 275 MG TABLET    Take 275 mg by mouth every 8 (eight) hours as needed.   NEPAFENAC (NEVANAC) 0.1 % OPHTHALMIC SUSPENSION    Place 1 drop into the left eye 3 (three) times daily.    POLYETHYLENE GLYCOL (MIRALAX / GLYCOLAX) PACKET    Take 17 g by mouth. 17gram in beverage of choice every other morning for constipation   POTASSIUM CHLORIDE (MICRO-K) 10 MEQ CR CAPSULE    Take 20 mEq by mouth daily.    SENNA (SENOKOT) 8.6 MG TABS    Take 1 tablet by mouth at bedtime.  Modified Medications   No medications on file  Discontinued Medications   No medications on file     Physical Exam: GENERAL APPEARANCE: No acute distress, appropriately groomed, normal body habitus. Alert, pleasant, conversant.   HEAD: Normocephalic, atraumatic   EYES: PERRL NOSE: No deformity or discharge.   MOUTH/THROAT: Lips w/o lesions. Oral mucosa, tongue moist, w/o lesion. Oropharynx w/o redness or lesions.    RESPIRATORY: Breathing is even, unlabored. Lung sounds are clear and full.   CARDIOVASCULAR: Heart irregular No murmur or extra heart sounds, edema 1+ bilaterally GASTROINTESTINAL: Abdomen is soft, non-tender, not distended w/ normal bowel sounds.  MUSCULOSKELETAL: Moves all extremities with full ROM, strength and tone. Gait is steady w/ walker, requires some assist to rise from chair   NEUROLOGIC: Not oriented to time, place, person.Speech clear, no tremor. Advanced STM loss, dementia   PSYCHIATRIC: Mood and affect appropriate to situation   Filed Vitals:   01/26/14 1023  BP: 106/55  Pulse: 60  Temp: 96.5 F (35.8 C)  Resp: 18  Weight: 189 lb (85.73 kg)  SpO2: 95%      Labs reviewed: Basic Metabolic Panel:  Recent Labs  08/25/13 10/22/13 12/08/13  NA 142 139 143   K 3.9 4.2 3.8  BUN 36* 25* 29*  CREATININE 0.8 0.7 0.8   CBC:  Recent Labs  08/25/13 10/22/13  WBC 2.4 2.7  HGB 9.9* 10.8*  HCT 29* 32*  PLT 60* 73*   TSH:  Recent Labs  08/25/13  TSH 1.88     Assessment/Plan 1. HYPERTENSION -has remained stable  2. Alzheimer's disease Advance, with cont decline in memory  3. ANEMIA -stable at this time  4. Chronic atrial fibrillation -rate controlled, not on anticoagulation due to thrombocytopenia   5. DIASTOLIC DYSFUNCTION -weights are stable, no shortness of breath, CP, or increased edema

## 2014-03-16 ENCOUNTER — Non-Acute Institutional Stay (SKILLED_NURSING_FACILITY): Payer: No Typology Code available for payment source | Admitting: Nurse Practitioner

## 2014-03-16 DIAGNOSIS — I482 Chronic atrial fibrillation, unspecified: Secondary | ICD-10-CM

## 2014-03-16 DIAGNOSIS — I4891 Unspecified atrial fibrillation: Secondary | ICD-10-CM

## 2014-03-16 DIAGNOSIS — M1990 Unspecified osteoarthritis, unspecified site: Secondary | ICD-10-CM

## 2014-03-16 DIAGNOSIS — I519 Heart disease, unspecified: Secondary | ICD-10-CM

## 2014-03-16 DIAGNOSIS — I10 Essential (primary) hypertension: Secondary | ICD-10-CM

## 2014-03-16 DIAGNOSIS — F0280 Dementia in other diseases classified elsewhere without behavioral disturbance: Secondary | ICD-10-CM

## 2014-03-16 DIAGNOSIS — G309 Alzheimer's disease, unspecified: Secondary | ICD-10-CM

## 2014-03-16 NOTE — Progress Notes (Signed)
Patient ID: Nicole Roach, female   DOB: [birth year], 80 y.o.   MRN: [id number]    Nursing Home Location:  [place]   Place of Service: SNF (31)  PCP: Estill Dooms, MD  Allergies  Allergen Reactions  . Avelox [Moxifloxacin Hcl In Nacl]   . Levaquin [Levofloxacin In D5w]   . Quinolones   . Tetanus Toxoids     Chief Complaint  Patient presents with  . Medical Management of Chronic Issues    HPI:  This is a 80 y.o. female resident of [facility], Skilled Nursing section evaluated today for management of ongoing medical issues. Pt has remained stable in the last month. There has been no noted cognitive decline. Still able to do ADLs with minimal to no assist. Staff has noticed she is unable to ambulate as far. There are no new issues today from nursing or patient reported.  Review of Systems:  DATA OBTAINED: from patient, nurse, medical record   GENERAL:No fevers, fatigue, change in appetite or weight    MOUTH/THROAT: No mouth or tooth pain No sore throat No difficulty chewing or swallowing   RESPIRATORY: No cough, wheezing, SOB   CARDIAC: No chest pain, palpitations. Lower ext edema (chronic)- wears TEDs GI: No abdominal pain No N/V/D or constipation No heartburn or reflux.  incontinence of bowel GU: No dysuria, frequency or urgency No change in urine volume or character Frequent incontinence of bladder MUSCULOSKELETAL: No joint pain, swelling or stiffness No back pain No muscle ache, pain, weakness Gait is steady w/ walker No recent falls.   NEUROLOGIC: No dizziness, fainting, headache No change in mental status (dementia).  PSYCHIATRIC: No signs of anxiety, depression Sleeps well. No behavior issue.    Past Medical History  Diagnosis Date  . Alzheimer's disease 12/01/2012  . Osteoarthrosis, unspecified whether generalized or localized, unspecified site 1999  . Unspecified essential hypertension 2000  . Macular degeneration  (senile) of retina, unspecified 2005    Left eye, status post posterior vitrectomy  . Atrial fibrillation 2008    warfarin DC 2011 due to to anemia  . Unspecified vitamin D deficiency 2009  . Vitreous hemorrhage 2011  . Depressive disorder, not elsewhere classified 2011  . Anemia, unspecified 2011  . Thrombocytopenia, unspecified 2011  . Pneumonia, organism unspecified 2011  . Debility, unspecified 2011  . Diastolic heart failure 4008    2D echocardiogram w/ wevidence of diastolic dysfunction  . Primary pulmonary hypertension 2011  . Lipoma of other skin and subcutaneous tissue 2011    large lipoma rt. mid back  . Leukocytopenia, unspecified 2011  . Impacted cerumen 2011  . Conjunctivitis unspecified 2011  . Ganglion and cyst of synovium, tendon and bursa 2012  . Other B-complex deficiencies 2012  . Unspecified constipation 2013  . Loss of weight 05/2012   Past Surgical History  Procedure Laterality Date  . Tonsillectomy and adenoidectomy  1927  . Thyroidectomy, partial  1950s  . Total hip arthroplasty Right 1999  . Cataract extraction w/ intraocular lens  implant, bilateral Bilateral 2002  . Vitrectomy Left 2005    Posterior vitrectomy and repair  . Vitrectomy Left 2/ 2011   Social History:   reports that she has quit smoking. Her smoking use included Cigarettes. She smoked 0.00 packs per day for 30 years. She does not have any smokeless tobacco history on file. She reports that she does not drink alcohol. Her drug history is not on file.  No family history  on file.  Medications: Patient's Medications  New Prescriptions   No medications on file  Previous Medications   BRIMONIDINE-TIMOLOL (COMBIGAN) 0.2-0.5 % OPHTHALMIC SOLUTION    Place 1 drop into the left eye 2 (two) times daily.   CHLORHEXIDINE (PERIDEX) 0.12 % SOLUTION    Use as directed 15 mLs in the mouth or throat 2 (two) times daily.   CHOLECALCIFEROL (VITAMIN D) 2000 UNITS TABLET    Take 2,000 Units by mouth  daily.   DONEPEZIL (ARICEPT) 10 MG TABLET    Take 5 mg by mouth at bedtime.    FUROSEMIDE (LASIX) 40 MG TABLET    Take 60 mg by mouth daily.    MEMANTINE HCL ER (NAMENDA XR) 28 MG CP24    Take 1 capsule by mouth daily.   METOPROLOL SUCCINATE (TOPROL-XL) 25 MG 24 HR TABLET    Take 1 tablet (25 mg total) by mouth daily.   MULTIPLE VITAMINS-MINERALS (PRESERVISION AREDS PO)    Take 1 capsule by mouth daily.   NAPROXEN SODIUM (ANAPROX) 275 MG TABLET    Take 275 mg by mouth every 8 (eight) hours as needed.   NEPAFENAC (NEVANAC) 0.1 % OPHTHALMIC SUSPENSION    Place 1 drop into the left eye 3 (three) times daily.    POLYETHYLENE GLYCOL (MIRALAX / GLYCOLAX) PACKET    Take 17 g by mouth. 17gram in beverage of choice every other morning for constipation   POTASSIUM CHLORIDE (MICRO-K) 10 MEQ CR CAPSULE    Take 20 mEq by mouth daily.    SENNA (SENOKOT) 8.6 MG TABS    Take 1 tablet by mouth at bedtime.  Modified Medications   No medications on file  Discontinued Medications   No medications on file     Physical Exam: GENERAL APPEARANCE: No acute distress, appropriately groomed, normal body habitus. Alert, pleasant, conversant.   HEAD: Normocephalic, atraumatic   EYES: PERRL NOSE: No deformity or discharge.   MOUTH/THROAT: Lips w/o lesions. Oral mucosa, tongue moist, w/o lesion. Oropharynx w/o redness or lesions.    RESPIRATORY: Breathing is even, unlabored. Lung sounds are clear CARDIOVASCULAR: Heart irregular No murmur or extra heart sounds, edema 1+ bilaterally GASTROINTESTINAL: Abdomen is soft, non-tender, not distended w/ normal bowel sounds.  MUSCULOSKELETAL: Moves all extremities with full ROM, strength and tone. Gait is steady w/ walker.  NEUROLOGIC: Not oriented to time, place, person.Speech clear, no tremor. Advanced STM loss, dementia   PSYCHIATRIC: Mood and affect appropriate to situation   Filed Vitals:   03/16/14 1041  BP: 117/65  Pulse: 63  Temp: 97.5 F (36.4 C)  Resp: 18    Weight: 192 lb 6.4 oz (87.272 kg)      Labs reviewed: Basic Metabolic Panel:  Recent Labs  08/25/13 10/22/13 12/08/13  NA 142 139 143  K 3.9 4.2 3.8  BUN 36* 25* 29*  CREATININE 0.8 0.7 0.8   CBC:  Recent Labs  08/25/13 10/22/13  WBC 2.4 2.7  HGB 9.9* 10.8*  HCT 29* 32*  PLT 60* 73*   TSH:  Recent Labs  08/25/13  TSH 1.88     Assessment/Plan 1. Chronic atrial fibrillation - rate controlled on current medication -coumadin was stopped due to persistent anemia and pt with chronic low plts   2. HYPERTENSION -blood pressure well controlled  3. Alzheimer's disease -advanced dementia however still able to participate in ADLs, there has been no changes in functional status   4. DIASTOLIC DYSFUNCTION -euvolemic, conts on lasix  5. OSTEOARTHRITIS -aleve  was stopped due to non-use, no complaints of pain

## 2014-03-17 LAB — BASIC METABOLIC PANEL
BUN: 26 mg/dL — AB (ref 4–21)
CREATININE: 1 mg/dL (ref <=1.1)
Potassium: 4.1 mmol/L (ref 3.4–5.3)
Sodium: 141 mmol/L (ref 137–147)

## 2014-04-13 ENCOUNTER — Non-Acute Institutional Stay (SKILLED_NURSING_FACILITY): Payer: No Typology Code available for payment source | Admitting: Nurse Practitioner

## 2014-04-13 DIAGNOSIS — I10 Essential (primary) hypertension: Secondary | ICD-10-CM

## 2014-04-13 DIAGNOSIS — I519 Heart disease, unspecified: Secondary | ICD-10-CM

## 2014-04-13 DIAGNOSIS — D649 Anemia, unspecified: Secondary | ICD-10-CM

## 2014-04-13 DIAGNOSIS — F0280 Dementia in other diseases classified elsewhere without behavioral disturbance: Secondary | ICD-10-CM

## 2014-04-13 DIAGNOSIS — G309 Alzheimer's disease, unspecified: Secondary | ICD-10-CM

## 2014-04-13 DIAGNOSIS — K5900 Constipation, unspecified: Secondary | ICD-10-CM | POA: Insufficient documentation

## 2014-04-13 NOTE — Progress Notes (Signed)
Patient ID: Nicole Roach, female   DOB: 1922/01/04, 80 y.o.   MRN: [id number]    Nursing Home Location:  [place]   Place of Service: SNF (31)  PCP: Estill Dooms, MD  Allergies  Allergen Reactions  . Avelox [Moxifloxacin Hcl In Nacl]   . Levaquin [Levofloxacin In D5w]   . Quinolones   . Tetanus Toxoids     Chief Complaint  Patient presents with  . Medical Management of Chronic Issues    HPI:  This is a 80 y.o. female resident of [facility], Skilled Nursing section evaluated today for management of ongoing medical issues. In the last month pt had blood found on pad, i&O cath obtained with bloody urine, however culture was neg. Urine spontaneously returned to normal within 24 hours. CBC was obtain which showed hgb at baseline.  Otherwise pt remains at baseline.   Review of Systems:  DATA OBTAINED: from patient, nurse  GENERAL:No fevers, fatigue, change in appetite or weight    MOUTH/THROAT: No mouth or tooth pain No sore throat No difficulty chewing or swallowing   RESPIRATORY: No cough, wheezing, SOB   CARDIAC: No chest pain, palpitations. Lower ext edema (chronic)- wears TEDs GI: No abdominal pain No N/V/D or constipation No heartburn or reflux.  incontinence of bowel GU: No dysuria, frequency or urgency Frequent incontinence of bladder MUSCULOSKELETAL: No joint pain, swelling or stiffness No back pain No muscle ache, pain, weakness Gait is steady w/ walker No recent falls.   NEUROLOGIC: No dizziness, fainting, headache No change in mental status (dementia).  PSYCHIATRIC: No signs of anxiety, depression Sleeps well. No behavior issue.    Past Medical History  Diagnosis Date  . Alzheimer's disease 12/01/2012  . Osteoarthrosis, unspecified whether generalized or localized, unspecified site 1999  . Unspecified essential hypertension 2000  . Macular degeneration (senile) of retina, unspecified 2005    Left eye, status post  posterior vitrectomy  . Atrial fibrillation 2008    warfarin DC 2011 due to to anemia  . Unspecified vitamin D deficiency 2009  . Vitreous hemorrhage 2011  . Depressive disorder, not elsewhere classified 2011  . Anemia, unspecified 2011  . Thrombocytopenia, unspecified 2011  . Pneumonia, organism unspecified 2011  . Debility, unspecified 2011  . Diastolic heart failure 2993    2D echocardiogram w/ wevidence of diastolic dysfunction  . Primary pulmonary hypertension 2011  . Lipoma of other skin and subcutaneous tissue 2011    large lipoma rt. mid back  . Leukocytopenia, unspecified 2011  . Impacted cerumen 2011  . Conjunctivitis unspecified 2011  . Ganglion and cyst of synovium, tendon and bursa 2012  . Other B-complex deficiencies 2012  . Unspecified constipation 2013  . Loss of weight 05/2012   Past Surgical History  Procedure Laterality Date  . Tonsillectomy and adenoidectomy  1927  . Thyroidectomy, partial  1950s  . Total hip arthroplasty Right 1999  . Cataract extraction w/ intraocular lens  implant, bilateral Bilateral 2002  . Vitrectomy Left 2005    Posterior vitrectomy and repair  . Vitrectomy Left 2/ 2011   Social History:   reports that she has quit smoking. Her smoking use included Cigarettes. She smoked 0.00 packs per day for 30 years. She does not have any smokeless tobacco history on file. She reports that she does not drink alcohol. Her drug history is not on file.  No family history on file.  Medications: Patient's Medications  New Prescriptions   No medications  on file  Previous Medications   BRIMONIDINE-TIMOLOL (COMBIGAN) 0.2-0.5 % OPHTHALMIC SOLUTION    Place 1 drop into the left eye 2 (two) times daily.   CHLORHEXIDINE (PERIDEX) 0.12 % SOLUTION    Use as directed 15 mLs in the mouth or throat 2 (two) times daily.   CHOLECALCIFEROL (VITAMIN D) 2000 UNITS TABLET    Take 2,000 Units by mouth daily.   DONEPEZIL (ARICEPT) 10 MG TABLET    Take 5 mg by mouth  at bedtime.    FUROSEMIDE (LASIX) 40 MG TABLET    Take 60 mg by mouth daily.    MEMANTINE HCL ER (NAMENDA XR) 28 MG CP24    Take 1 capsule by mouth daily.   METOPROLOL SUCCINATE (TOPROL-XL) 25 MG 24 HR TABLET    Take 1 tablet (25 mg total) by mouth daily.   MULTIPLE VITAMINS-MINERALS (PRESERVISION AREDS PO)    Take 1 capsule by mouth daily.   NEPAFENAC (NEVANAC) 0.1 % OPHTHALMIC SUSPENSION    Place 1 drop into the left eye 3 (three) times daily.    POLYETHYLENE GLYCOL (MIRALAX / GLYCOLAX) PACKET    Take 17 g by mouth. 17gram in beverage of choice every other morning for constipation   POTASSIUM CHLORIDE (MICRO-K) 10 MEQ CR CAPSULE    Take 20 mEq by mouth daily.    SENNA (SENOKOT) 8.6 MG TABS    Take 1 tablet by mouth at bedtime.  Modified Medications   No medications on file  Discontinued Medications   No medications on file     Physical Exam: GENERAL APPEARANCE: No acute distress, appropriately groomed, normal body habitus. Alert, pleasant, conversant.   HEAD: Normocephalic, atraumatic   EYES: PERRL NOSE: No deformity or discharge.   MOUTH/THROAT: Lips w/o lesions. Oral mucosa, tongue moist, w/o lesion. Oropharynx w/o redness or lesions.    RESPIRATORY: Breathing is even, unlabored. Lung sounds are clear CARDIOVASCULAR: Heart irregular No murmur or extra heart sounds, edema 1+ bilaterally GASTROINTESTINAL: Abdomen is soft, non-tender, not distended w/ normal bowel sounds.  MUSCULOSKELETAL: Moves all extremities with full ROM, strength and tone. Gait is steady w/ walker.  NEUROLOGIC: Not oriented to time, place, person.Speech clear, no tremor. Advanced STM loss, dementia   PSYCHIATRIC: Mood and affect appropriate to situation   Filed Vitals:   04/13/14 1321  BP: 119/64  Pulse: 64  Temp: 98.3 F (36.8 C)  Resp: 18  Weight: 194 lb (87.998 kg)      Labs reviewed: Basic Metabolic Panel:  Recent Labs  08/25/13 10/22/13 12/08/13  NA 142 139 143  K 3.9 4.2 3.8  BUN 36* 25*  29*  CREATININE 0.8 0.7 0.8   CBC:  Recent Labs  08/25/13 10/22/13  WBC 2.4 2.7  HGB 9.9* 10.8*  HCT 29* 32*  PLT 60* 73*   TSH:  Recent Labs  08/25/13  TSH 1.88     Assessment/Plan 1. HYPERTENSION -Patient is stable; continue current regimen. Will monitor and make changes as necessary.  2. Alzheimer's disease There has been little change in cognitive or functional status the last month, pt conts on aricept and namenda   3. ANEMIA hgb stable on last labs.   4. Diastolic Dysfunction -remains stable, no signs of worsening CHF   5. Constipation -well controlled on current regimen

## 2014-05-18 ENCOUNTER — Non-Acute Institutional Stay (SKILLED_NURSING_FACILITY): Payer: No Typology Code available for payment source | Admitting: Nurse Practitioner

## 2014-05-18 DIAGNOSIS — H3530 Unspecified macular degeneration: Secondary | ICD-10-CM

## 2014-05-18 DIAGNOSIS — I10 Essential (primary) hypertension: Secondary | ICD-10-CM

## 2014-05-18 DIAGNOSIS — I482 Chronic atrial fibrillation, unspecified: Secondary | ICD-10-CM

## 2014-05-18 DIAGNOSIS — F0390 Unspecified dementia without behavioral disturbance: Secondary | ICD-10-CM

## 2014-05-18 DIAGNOSIS — R319 Hematuria, unspecified: Secondary | ICD-10-CM

## 2014-05-18 DIAGNOSIS — D649 Anemia, unspecified: Secondary | ICD-10-CM

## 2014-05-18 NOTE — Progress Notes (Signed)
Patient ID: Nicole Roach, female   DOB: Jun 10, 1922, 80 y.o.   MRN: [id number]     Nursing Home Location:  [place]   Place of Service: SNF (31)  PCP: Estill Dooms, MD  Allergies  Allergen Reactions  . Avelox [Moxifloxacin Hcl In Nacl]   . Levaquin [Levofloxacin In D5w]   . Quinolones   . Tetanus Toxoids     Chief Complaint  Patient presents with  . Medical Management of Chronic Issues    HPI:  This is a 80 y.o. female resident of [facility], Skilled Nursing section evaluated today for management of ongoing medical issues. Pt has been stable however staff notes that pt still will have occasional blood in urine that will spontaneously returned to normal. Eating well. Gets small portions due to weight gain. Edema has been stable. Ambulation has decreased. Not walking as much and fatigued easily   Review of Systems:  DATA OBTAINED: from patient, nurse  GENERAL:No fevers, fatigue, change in appetite or weight    MOUTH/THROAT: No mouth or tooth pain No sore throat No difficulty chewing or swallowing   RESPIRATORY: No cough, wheezing, SOB   CARDIAC: No chest pain, palpitations. Lower ext edema (chronic)- wears TEDs GI: No abdominal pain No N/V/D or constipation No heartburn or reflux.  incontinence of bowel GU: No dysuria, frequency or urgency Frequent incontinence of bladder MUSCULOSKELETAL: No joint pain, swelling or stiffness No back pain No muscle ache, pain, weakness; uses WC NEUROLOGIC: No dizziness, fainting, headache No change in mental status (dementia).  PSYCHIATRIC: No signs of anxiety, depression Sleeps well. No behavior issue.    Past Medical History  Diagnosis Date  . Alzheimer's disease 12/01/2012  . Osteoarthrosis, unspecified whether generalized or localized, unspecified site 1999  . Unspecified essential hypertension 2000  . Macular degeneration (senile) of retina, unspecified 2005    Left eye, status post  posterior vitrectomy  . Atrial fibrillation 2008    warfarin DC 2011 due to to anemia  . Unspecified vitamin D deficiency 2009  . Vitreous hemorrhage 2011  . Depressive disorder, not elsewhere classified 2011  . Anemia, unspecified 2011  . Thrombocytopenia, unspecified 2011  . Pneumonia, organism unspecified 2011  . Debility, unspecified 2011  . Diastolic heart failure 6568    2D echocardiogram w/ wevidence of diastolic dysfunction  . Primary pulmonary hypertension 2011  . Lipoma of other skin and subcutaneous tissue 2011    large lipoma rt. mid back  . Leukocytopenia, unspecified 2011  . Impacted cerumen 2011  . Conjunctivitis unspecified 2011  . Ganglion and cyst of synovium, tendon and bursa 2012  . Other B-complex deficiencies 2012  . Unspecified constipation 2013  . Loss of weight 05/2012   Past Surgical History  Procedure Laterality Date  . Tonsillectomy and adenoidectomy  1927  . Thyroidectomy, partial  1950s  . Total hip arthroplasty Right 1999  . Cataract extraction w/ intraocular lens  implant, bilateral Bilateral 2002  . Vitrectomy Left 2005    Posterior vitrectomy and repair  . Vitrectomy Left 2/ 2011   Social History:   reports that she has quit smoking. Her smoking use included Cigarettes. She smoked 0.00 packs per day for 30 years. She does not have any smokeless tobacco history on file. She reports that she does not drink alcohol. Her drug history is not on file.  No family history on file.  Medications: Patient's Medications  New Prescriptions   No medications on file  Previous  Medications   BRIMONIDINE-TIMOLOL (COMBIGAN) 0.2-0.5 % OPHTHALMIC SOLUTION    Place 1 drop into the left eye 2 (two) times daily.   CHLORHEXIDINE (PERIDEX) 0.12 % SOLUTION    Use as directed 15 mLs in the mouth or throat 2 (two) times daily.   CHOLECALCIFEROL (VITAMIN D) 2000 UNITS TABLET    Take 2,000 Units by mouth daily.   DONEPEZIL (ARICEPT) 10 MG TABLET    Take 5 mg by mouth  at bedtime.    FUROSEMIDE (LASIX) 40 MG TABLET    Take 60 mg by mouth daily.    MEMANTINE HCL ER (NAMENDA XR) 28 MG CP24    Take 1 capsule by mouth daily.   METOPROLOL SUCCINATE (TOPROL-XL) 25 MG 24 HR TABLET    Take 1 tablet (25 mg total) by mouth daily.   MULTIPLE VITAMINS-MINERALS (PRESERVISION AREDS PO)    Take 1 capsule by mouth daily.   NEPAFENAC (NEVANAC) 0.1 % OPHTHALMIC SUSPENSION    Place 1 drop into the left eye 3 (three) times daily.    POLYETHYLENE GLYCOL (MIRALAX / GLYCOLAX) PACKET    Take 17 g by mouth. 17gram in beverage of choice every other morning for constipation   POTASSIUM CHLORIDE (MICRO-K) 10 MEQ CR CAPSULE    Take 20 mEq by mouth daily.    SENNA (SENOKOT) 8.6 MG TABS    Take 1 tablet by mouth at bedtime.  Modified Medications   No medications on file  Discontinued Medications   No medications on file     Physical Exam: GENERAL APPEARANCE: No acute distress, appropriately groomed, normal body habitus. Alert, pleasant, conversant.   HEAD: Normocephalic, atraumatic   EYES: PERRL NOSE: No deformity or discharge.   MOUTH/THROAT: Lips w/o lesions. Oral mucosa, tongue moist, w/o lesion. Oropharynx w/o redness or lesions.    RESPIRATORY: Breathing is even, unlabored. Lung sounds are clear CARDIOVASCULAR: Heart irregular No murmur or extra heart sounds, edema 1+ bilaterally GASTROINTESTINAL: Abdomen is soft, non-tender, not distended w/ normal bowel sounds.  MUSCULOSKELETAL: Moves all extremities with full ROM, strength and tone. Gait is steady w/ walker.  NEUROLOGIC: Not oriented to time, place, person.Speech clear, no tremor. Advanced STM loss, dementia   PSYCHIATRIC: Mood and affect appropriate to situation   Filed Vitals:   05/18/14 1159  BP: 123/63  Pulse: 60  Temp: 98 F (36.7 C)  Resp: 18  Weight: 198 lb (89.812 kg)      Labs reviewed: Basic Metabolic Panel:  Recent Labs  08/25/13 10/22/13 12/08/13  NA 142 139 143  K 3.9 4.2 3.8  BUN 36* 25* 29*    CREATININE 0.8 0.7 0.8   CBC:  Recent Labs  08/25/13 10/22/13  WBC 2.4 2.7  HGB 9.9* 10.8*  HCT 29* 32*  PLT 60* 73*   TSH:  Recent Labs  08/25/13  TSH 1.88     Assessment/Plan  1. Essential hypertension -Patient is stable; continue current regimen. Will monitor and make changes as necessary.  2. Chronic atrial fibrillation Not currently on anticoagulation due to persistent anemia. Rate controlled   3. Dementia, without behavioral disturbance -progressive decline noted functional decline over the last few months. Uses walker more often.  conts on aricept and namenda  4. Macular degeneration (senile) of retina -blind, conts ophthalmic solutions  5. Anemia, unspecified anemia type -will follow up cbc due to blood in urine  6. Blood in urine -spoke with POA Starla regarding plan of care for Ms Briney. She does not wish for  any workup at this time due to age and co-morbities. Also discussed MOST form and does not want pt hospitalization, comfort measures only, IV fluids for a defined period of time and antibiotics can be used.  Lavella Hammock would also like hospice services to be consulted in the time or event it is determined that she is declining.

## 2014-05-20 LAB — CBC AND DIFFERENTIAL
HEMATOCRIT: 27 % — AB (ref 36–46)
HEMOGLOBIN: 9.3 g/dL — AB (ref 12.0–16.0)
PLATELETS: 64 10*3/uL — AB (ref 150–399)
WBC: 2.2 10*3/mL

## 2014-05-20 LAB — BASIC METABOLIC PANEL: Glucose: 129 mg/dL

## 2014-06-21 ENCOUNTER — Non-Acute Institutional Stay (SKILLED_NURSING_FACILITY): Payer: No Typology Code available for payment source | Admitting: Adult Health

## 2014-06-21 DIAGNOSIS — D61818 Other pancytopenia: Secondary | ICD-10-CM

## 2014-06-21 DIAGNOSIS — I482 Chronic atrial fibrillation, unspecified: Secondary | ICD-10-CM

## 2014-06-21 DIAGNOSIS — R05 Cough: Secondary | ICD-10-CM

## 2014-06-21 DIAGNOSIS — F0280 Dementia in other diseases classified elsewhere without behavioral disturbance: Secondary | ICD-10-CM

## 2014-06-21 DIAGNOSIS — G309 Alzheimer's disease, unspecified: Secondary | ICD-10-CM

## 2014-06-21 DIAGNOSIS — K5901 Slow transit constipation: Secondary | ICD-10-CM

## 2014-06-21 DIAGNOSIS — I10 Essential (primary) hypertension: Secondary | ICD-10-CM

## 2014-06-21 DIAGNOSIS — R059 Cough, unspecified: Secondary | ICD-10-CM

## 2014-06-21 NOTE — Progress Notes (Signed)
Patient ID: Nicole Roach, female   DOB: January 01, 1922, 80 y.o.   MRN: [id number]  Nursing Home Location:  [facility]   Code Status: DNR with comfort measures   Place of Service:    Chief Complaint  Patient presents with  . Medical Management of Chronic Issues    HPI:  80 y.o. female residing at [facility], skilled care section. I am here to review her chronic medical issues.  VS have been stable over the past month. Weight has remained stable at 199.4 lbs. Upon our visit I noticed that she had a runny nose and cough. She has dementia and can not provide an accurate hx but the staff said that these symptoms started today. She currently uses the [facility] for mobility and is incontinent of urine. She has a hx of pancytopenia and blood in her urine but given her age, debility, and dementia, her POA has chosen not to work these issues up.  The blood in her urine is intermittent per the staff. Her WBC, Hgb and plt count have drifted down over time.  Review of Systems:  Review of Systems  Constitutional: Negative for fever, chills, activity change and appetite change.  HENT: Positive for congestion and rhinorrhea. Negative for ear pain, nosebleeds, sore throat and trouble swallowing.   Respiratory: Positive for cough. Negative for shortness of breath and wheezing.   Cardiovascular: Positive for leg swelling. Negative for chest pain and palpitations.  Gastrointestinal: Negative for abdominal pain, constipation and abdominal distention.  Genitourinary: Positive for hematuria. Negative for dysuria.  Musculoskeletal: Positive for gait problem. Negative for back pain and arthralgias.  Skin: Negative for color change and rash.  Neurological: Negative for dizziness, seizures, speech difficulty and light-headedness.  Psychiatric/Behavioral: Positive for confusion. Negative for behavioral problems and agitation.    Medications: Patient's Medications  New  Prescriptions   No medications on file  Previous Medications   BRIMONIDINE-TIMOLOL (COMBIGAN) 0.2-0.5 % OPHTHALMIC SOLUTION    Place 1 drop into the left eye 2 (two) times daily.   CHLORHEXIDINE (PERIDEX) 0.12 % SOLUTION    Use as directed 15 mLs in the mouth or throat 2 (two) times daily.   CHOLECALCIFEROL (VITAMIN D) 2000 UNITS TABLET    Take 2,000 Units by mouth daily.   DONEPEZIL (ARICEPT) 10 MG TABLET    Take 5 mg by mouth at bedtime.    FUROSEMIDE (LASIX) 40 MG TABLET    Take 60 mg by mouth daily.    MEMANTINE HCL ER (NAMENDA XR) 28 MG CP24    Take 1 capsule by mouth daily.   METOPROLOL SUCCINATE (TOPROL-XL) 25 MG 24 HR TABLET    Take 1 tablet (25 mg total) by mouth daily.   MULTIPLE VITAMINS-MINERALS (PRESERVISION AREDS PO)    Take 1 capsule by mouth daily.   NEPAFENAC (NEVANAC) 0.1 % OPHTHALMIC SUSPENSION    Place 1 drop into the left eye 3 (three) times daily.    POLYETHYLENE GLYCOL (MIRALAX / GLYCOLAX) PACKET    Take 17 g by mouth. 17gram in beverage of choice every other morning for constipation   POTASSIUM CHLORIDE (MICRO-K) 10 MEQ CR CAPSULE    Take 20 mEq by mouth daily.    SENNA (SENOKOT) 8.6 MG TABS    Take 1 tablet by mouth at bedtime.  Modified Medications   No medications on file  Discontinued Medications   No medications on file     Physical Exam:  Filed Vitals:   06/21/14  1559  BP: 140/68  Pulse: 73  Temp: 97.9 F (36.6 C)  Resp: 14  Weight: 199 lb 6.4 oz (90.447 kg)    Physical Exam  Constitutional: No distress.  frail  HENT:  Head: Normocephalic.  Mouth/Throat: Oropharyngeal exudate present.  Eyes: Conjunctivae are normal. Pupils are equal, round, and reactive to light. Right eye exhibits no discharge. Left eye exhibits no discharge.  Neck: No JVD present. No tracheal deviation present. No thyromegaly present.  Cardiovascular: Normal rate.  An irregularly irregular rhythm present.  No murmur heard. bilat lower ext edema +1  Pulmonary/Chest: Effort  normal and breath sounds normal. No respiratory distress.  Abdominal: Soft. Bowel sounds are normal. She exhibits no distension. There is no tenderness.  Lymphadenopathy:    She has cervical adenopathy.  Neurological: She is alert. No cranial nerve deficit.  Oriented to self and situation. Able to answer q's but poor historian. Able to f/c  Skin: Skin is warm and dry. She is not diaphoretic.  Psychiatric: Affect normal.    Labs reviewed/Significant Diagnostic Results:  Basic Metabolic Panel:  Recent Labs  10/22/13 12/08/13 03/17/14  NA 139 143 141  K 4.2 3.8 4.1  BUN 25* 29* 26*  CREATININE 0.7 0.8 1.0   Liver Function Tests: No results for input(s): AST, ALT, ALKPHOS, BILITOT, PROT, ALBUMIN in the last 8760 hours. No results for input(s): LIPASE, AMYLASE in the last 8760 hours. No results for input(s): AMMONIA in the last 8760 hours. CBC:  Recent Labs  08/25/13 10/22/13 05/20/14  WBC 2.4 2.7 2.2  HGB 9.9* 10.8* 9.3*  HCT 29* 32* 27*  PLT 60* 73* 64*   CBG: No results for input(s): GLUCAP in the last 8760 hours. TSH:  Recent Labs  08/25/13  TSH 1.88   A1C: Lab Results  Component Value Date   HGBA1C * 11/02/2009    6.5 (NOTE)                                                                       According to the ADA Clinical Practice Recommendations for 2011, when HbA1c is used as a screening test:   >=6.5%   Diagnostic of Diabetes Mellitus           (if abnormal result  is confirmed)  5.7-6.4%   Increased risk of developing Diabetes Mellitus  References:Diagnosis and Classification of Diabetes Mellitus,Diabetes OFBP,1025,85(IDPOE 1):S62-S69 and Standards of Medical Care in         Diabetes - 2011,Diabetes UMPN,3614,43  (Suppl 1):S11-S61.   05/20/14: Iron 81, TIBC 212, %sat 38, B12 364, Folate 11.5, ferritin 292   Assessment/Plan  Cough Robitussin 15cc TID for 72 hrs. Monitor VS and condition and report if declining.  Other pancytopenia Slightly worse over  time, continue to monitor. No intervention per POA wishes.  Alzheimer's disease Progressive decline in cognitive function over time. Continue Aricept and Namenda at this time.  ATRIAL FIBRILLATION, PAROXYSMAL Rate controlled, continues to be irregular. Continue current meds. Not on anticoagulation due to anemia.  Essential hypertension The current medical regimen is effective;  continue present plan and medications.   Constipation No issues with BM's. Continue Miralax and Senna     Cindi Carbon, ANP [facility] [phone]

## 2014-06-22 ENCOUNTER — Non-Acute Institutional Stay (SKILLED_NURSING_FACILITY): Payer: No Typology Code available for payment source | Admitting: Adult Health

## 2014-06-22 ENCOUNTER — Encounter: Payer: Self-pay | Admitting: Adult Health

## 2014-06-22 DIAGNOSIS — D61818 Other pancytopenia: Secondary | ICD-10-CM

## 2014-06-22 DIAGNOSIS — R0902 Hypoxemia: Secondary | ICD-10-CM

## 2014-06-22 NOTE — Progress Notes (Signed)
Patient ID: Nicole Roach, female   DOB: Dec 03, 1921, 80 y.o.   MRN: [id number]  Nursing Home Location:  [facility]   Code Status: DNR with most form   Place of Service: SNF (31)  Chief Complaint  Patient presents with  . Acute Visit    hypoxia    HPI: 80 y.o.  Female residing at [facility], skilled care section. I was asked to see her today due to worsening in her condition. Her oxygen sat was 81% on RA, and she was mildly lethargic. She reported feeling bad and cold. She was seen on 06/21/14 for routine issues and found to have a runny nose and a cough first noted that day. Robitussin was ordered. She has denied SOB or CP but has required oxygen. Her current sat is 98% on 2L. She has not had any n,v,d but has had a decreased appetite. She has a hx of pancytopenia and blood in the urine that we are only monitoring given her end of life wishes.     Review of Systems:  Review of Systems  Constitutional: Positive for chills, activity change, appetite change and fatigue. Negative for fever and diaphoresis.  HENT: Positive for congestion, postnasal drip and rhinorrhea. Negative for sinus pressure, sneezing, sore throat and trouble swallowing.   Eyes: Negative for pain, discharge and itching.  Respiratory: Positive for cough. Negative for choking, shortness of breath, wheezing and stridor.   Cardiovascular: Positive for leg swelling. Negative for chest pain and palpitations.  Gastrointestinal: Negative for abdominal pain, constipation and abdominal distention.  Genitourinary: Positive for hematuria. Negative for dysuria and difficulty urinating.  Neurological: Positive for weakness. Negative for tremors, syncope, facial asymmetry, speech difficulty and light-headedness.  Psychiatric/Behavioral: Positive for confusion. Negative for behavioral problems and agitation.    Medications: Patient's Medications  New Prescriptions   No medications on  file  Previous Medications   BRIMONIDINE-TIMOLOL (COMBIGAN) 0.2-0.5 % OPHTHALMIC SOLUTION    Place 1 drop into the left eye 2 (two) times daily.   CHLORHEXIDINE (PERIDEX) 0.12 % SOLUTION    Use as directed 15 mLs in the mouth or throat 2 (two) times daily.   CHOLECALCIFEROL (VITAMIN D) 2000 UNITS TABLET    Take 2,000 Units by mouth daily.   DONEPEZIL (ARICEPT) 10 MG TABLET    Take 5 mg by mouth at bedtime.    FUROSEMIDE (LASIX) 40 MG TABLET    Take 60 mg by mouth daily.    MEMANTINE HCL ER (NAMENDA XR) 28 MG CP24    Take 1 capsule by mouth daily.   METOPROLOL SUCCINATE (TOPROL-XL) 25 MG 24 HR TABLET    Take 1 tablet (25 mg total) by mouth daily.   MULTIPLE VITAMINS-MINERALS (PRESERVISION AREDS PO)    Take 1 capsule by mouth daily.   NEPAFENAC (NEVANAC) 0.1 % OPHTHALMIC SUSPENSION    Place 1 drop into the left eye 3 (three) times daily.    POLYETHYLENE GLYCOL (MIRALAX / GLYCOLAX) PACKET    Take 17 g by mouth. 17gram in beverage of choice every other morning for constipation   POTASSIUM CHLORIDE (MICRO-K) 10 MEQ CR CAPSULE    Take 20 mEq by mouth daily.    SENNA (SENOKOT) 8.6 MG TABS    Take 1 tablet by mouth at bedtime.  Modified Medications   No medications on file  Discontinued Medications   No medications on file     Physical Exam:  Filed Vitals:   06/22/14 1226  BP:  169/84  Pulse: 104  Temp: 98.6 F (37 C)  Resp: 24  SpO2: 81%    Physical Exam  Constitutional: No distress.  Frail elderly female  HENT:  Head: Normocephalic and atraumatic.  Eyes: Conjunctivae are normal. Pupils are equal, round, and reactive to light. Right eye exhibits no discharge. Left eye exhibits no discharge.  Neck: No JVD present. No tracheal deviation present.  Pulmonary/Chest: Effort normal. No respiratory distress. She has no wheezes.  bilat rhonchi  Abdominal: Soft. Bowel sounds are normal. She exhibits no distension. There is no tenderness.  Lymphadenopathy:    She has cervical adenopathy.    Neurological:  Groggy but able to follow commands, alert to self.  Skin: Skin is warm and dry. No rash noted. She is not diaphoretic. No erythema.    Labs reviewed/Significant Diagnostic Results:  Basic Metabolic Panel:  Recent Labs  10/22/13 12/08/13 03/17/14  NA 139 143 141  K 4.2 3.8 4.1  BUN 25* 29* 26*  CREATININE 0.7 0.8 1.0   Liver Function Tests: No results for input(s): AST, ALT, ALKPHOS, BILITOT, PROT, ALBUMIN in the last 8760 hours. No results for input(s): LIPASE, AMYLASE in the last 8760 hours. No results for input(s): AMMONIA in the last 8760 hours. CBC:  Recent Labs  08/25/13 10/22/13 05/20/14  WBC 2.4 2.7 2.2  HGB 9.9* 10.8* 9.3*  HCT 29* 32* 27*  PLT 60* 73* 64*   CBG: No results for input(s): GLUCAP in the last 8760 hours. TSH:  Recent Labs  08/25/13  TSH 1.88   A1C: Lab Results  Component Value Date   HGBA1C * 11/02/2009    6.5 (NOTE)                                                                       According to the ADA Clinical Practice Recommendations for 2011, when HbA1c is used as a screening test:   >=6.5%   Diagnostic of Diabetes Mellitus           (if abnormal result  is confirmed)  5.7-6.4%   Increased risk of developing Diabetes Mellitus  References:Diagnosis and Classification of Diabetes Mellitus,Diabetes BVQX,4503,88(EKCMK 1):S62-S69 and Standards of Medical Care in         Diabetes - 2011,Diabetes Care,2011,34  (Suppl 1):S11-S61.     Assessment/Plan Hypoxia Her condition has worsened since yesterday. She is a DNR and has a most form that implies comfort measures with antibiotics on a case to case basis. Will check PCXR. Given the hx of cough/cold symptoms with a rapid decline, she most likely has pneumonia. Augmenti 875 mg BID for 7days with florastor 1 cap BID for 7 days. Continue oxygen to maintain sats >90%. Continue Robitussin.   Other pancytopenia Her wbc count continues to drop over time. We will continue to monitor this  but the POA and resident do not wish to treat. Given this background, it may be difficult to recover from this illness  Labs/tests ordered [place], [place] [phone]

## 2014-06-22 NOTE — Assessment & Plan Note (Signed)
Her wbc count continues to drop over time. We will continue to monitor this but the POA and resident do not wish to treat. Given this background, it may be difficult to recover from this illness

## 2014-06-22 NOTE — Assessment & Plan Note (Signed)
Her condition has worsened since yesterday. She is a DNR and has a most form that implies comfort measures with antibiotics on a case to case basis. Will check PCXR. Given the hx of cough/cold symptoms with a rapid decline, she most likely has pneumonia. Augmenti 875 mg BID for 7days with florastor 1 cap BID for 7 days. Continue oxygen to maintain sats >90%. Continue Robitussin.

## 2014-06-23 ENCOUNTER — Non-Acute Institutional Stay (SKILLED_NURSING_FACILITY): Payer: No Typology Code available for payment source | Admitting: Nurse Practitioner

## 2014-06-23 DIAGNOSIS — R0902 Hypoxemia: Secondary | ICD-10-CM

## 2014-06-23 NOTE — Assessment & Plan Note (Signed)
Slightly worse over time, continue to monitor. No intervention per POA wishes.

## 2014-06-23 NOTE — Assessment & Plan Note (Addendum)
Robitussin 15cc TID for 72 hrs. Monitor VS and condition and report if declining.

## 2014-06-23 NOTE — Progress Notes (Signed)
Patient ID: Nicole Roach, female   DOB: January 11, 1922, 80 y.o.   MRN: [id number]    Patient ID: Nicole Roach, female   DOB: 18-Jan-1922, 80 y.o.   MRN: [id number]  Nursing Home Location:  [facility]   Code Status: DNR with most form   Place of Service: SNF (31)  Chief Complaint  Patient presents with  . Acute Visit    worsening shortness of breath, change in condition    HPI: 80 y.o.  Female residing at [facility], skilled care section being seen today due to change in her condition. Pt was seen yesterday due to increased lethargy and decrease O2 sats. Was placed on augementin for pneumonia and increased lasix due to increased pulomary vascular on chest xray. Pt is a DNR, keep in facility comfort care only. Pt has a hx of pancytopenia and blood in the urine that we are only monitoring given her end of life wishes. Today pt has had increased work of breathing and drop in sats to 92% on 2L, POA notified and wishes to keep pt comfortable but does not wish to consult hospice at this time. Pt has had a decrease in appetite. Did not eat any meals today. Taking minimal PO intake. Staff reports gurgling. Pt is alert but does not answer questions appropriately   Review of Systems:  Review of Systems  Unable to perform ROS: Dementia    Medications: Patient's Medications  New Prescriptions   No medications on file  Previous Medications   BRIMONIDINE-TIMOLOL (COMBIGAN) 0.2-0.5 % OPHTHALMIC SOLUTION    Place 1 drop into the left eye 2 (two) times daily.   CHLORHEXIDINE (PERIDEX) 0.12 % SOLUTION    Use as directed 15 mLs in the mouth or throat 2 (two) times daily.   CHOLECALCIFEROL (VITAMIN D) 2000 UNITS TABLET    Take 2,000 Units by mouth daily.   DONEPEZIL (ARICEPT) 10 MG TABLET    Take 5 mg by mouth at bedtime.    FUROSEMIDE (LASIX) 40 MG TABLET    Take 60 mg by mouth daily.    MEMANTINE HCL ER (NAMENDA XR) 28 MG CP24    Take 1 capsule by mouth daily.     METOPROLOL SUCCINATE (TOPROL-XL) 25 MG 24 HR TABLET    Take 1 tablet (25 mg total) by mouth daily.   MULTIPLE VITAMINS-MINERALS (PRESERVISION AREDS PO)    Take 1 capsule by mouth daily.   NEPAFENAC (NEVANAC) 0.1 % OPHTHALMIC SUSPENSION    Place 1 drop into the left eye 3 (three) times daily.    POLYETHYLENE GLYCOL (MIRALAX / GLYCOLAX) PACKET    Take 17 g by mouth. 17gram in beverage of choice every other morning for constipation   POTASSIUM CHLORIDE (MICRO-K) 10 MEQ CR CAPSULE    Take 20 mEq by mouth daily.    SENNA (SENOKOT) 8.6 MG TABS    Take 1 tablet by mouth at bedtime.  Modified Medications   No medications on file  Discontinued Medications   No medications on file     Physical Exam:  Filed Vitals:   06/23/14 1542  BP: 125/55  Pulse: 101  Temp: 98.4 F (36.9 C)  Resp: 28  SpO2: 92%    Physical Exam  Constitutional: No distress.  Frail elderly female  HENT:  Head: Normocephalic and atraumatic.  Eyes: Conjunctivae are normal. Pupils are equal, round, and reactive to light. Right eye exhibits no discharge. Left eye exhibits no discharge.  Neck: No JVD present.  Cardiovascular: Regular rhythm and normal heart sounds.  Tachycardia present.   Pulmonary/Chest: Accessory muscle usage present. Tachypnea noted. No respiratory distress. She has wheezes. She has rhonchi.  Abdominal: Soft. Bowel sounds are normal. She exhibits no distension. There is no tenderness.  Neurological:  Groggy but able to follow commands, alert to self.  Skin: Skin is warm and dry. No rash noted. She is not diaphoretic. No erythema.    Labs reviewed/Significant Diagnostic Results:  Basic Metabolic Panel:  Recent Labs  10/22/13 12/08/13 03/17/14  NA 139 143 141  K 4.2 3.8 4.1  BUN 25* 29* 26*  CREATININE 0.7 0.8 1.0   Liver Function Tests: No results for input(s): AST, ALT, ALKPHOS, BILITOT, PROT, ALBUMIN in the last 8760 hours. No results for input(s): LIPASE, AMYLASE in the last 8760  hours. No results for input(s): AMMONIA in the last 8760 hours. CBC:  Recent Labs  08/25/13 10/22/13 05/20/14  WBC 2.4 2.7 2.2  HGB 9.9* 10.8* 9.3*  HCT 29* 32* 27*  PLT 60* 73* 64*   CBG: No results for input(s): GLUCAP in the last 8760 hours. TSH:  Recent Labs  08/25/13  TSH 1.88   A1C: Lab Results  Component Value Date   HGBA1C * 11/02/2009    6.5 (NOTE)                                                                       According to the ADA Clinical Practice Recommendations for 2011, when HbA1c is used as a screening test:   >=6.5%   Diagnostic of Diabetes Mellitus           (if abnormal result  is confirmed)  5.7-6.4%   Increased risk of developing Diabetes Mellitus  References:Diagnosis and Classification of Diabetes Mellitus,Diabetes NOBS,9628,36(OQHUT 1):S62-S69 and Standards of Medical Care in         Diabetes - 2011,Diabetes Care,2011,34  (Suppl 1):S11-S61.     Assessment/Plan  1. Hypoxia -worsening hypoxia with increase RR and work of breathing -Comfort measure enforced -Will start roxanol 5 mg q 6 hours PRN pain or dyspnea -to cont Augmentin and lasix -duonebs q 6 hours scheduled

## 2014-06-23 NOTE — Assessment & Plan Note (Signed)
Progressive decline in cognitive function over time. Continue Aricept and Namenda at this time.

## 2014-06-23 NOTE — Assessment & Plan Note (Signed)
Rate controlled, continues to be irregular. Continue current meds. Not on anticoagulation due to anemia.

## 2014-06-23 NOTE — Assessment & Plan Note (Signed)
The current medical regimen is effective;  continue present plan and medications.  

## 2014-06-23 NOTE — Assessment & Plan Note (Signed)
No issues with BM's. Continue Miralax and Senna

## 2014-06-25 ENCOUNTER — Non-Acute Institutional Stay (SKILLED_NURSING_FACILITY): Payer: No Typology Code available for payment source | Admitting: Internal Medicine

## 2014-06-25 DIAGNOSIS — F0280 Dementia in other diseases classified elsewhere without behavioral disturbance: Secondary | ICD-10-CM

## 2014-06-25 DIAGNOSIS — J209 Acute bronchitis, unspecified: Secondary | ICD-10-CM

## 2014-06-25 DIAGNOSIS — G309 Alzheimer's disease, unspecified: Secondary | ICD-10-CM

## 2014-06-25 DIAGNOSIS — I10 Essential (primary) hypertension: Secondary | ICD-10-CM

## 2014-06-29 ENCOUNTER — Encounter: Payer: Self-pay | Admitting: Adult Health

## 2014-06-29 ENCOUNTER — Non-Acute Institutional Stay (SKILLED_NURSING_FACILITY): Payer: No Typology Code available for payment source | Admitting: Adult Health

## 2014-06-29 DIAGNOSIS — J189 Pneumonia, unspecified organism: Secondary | ICD-10-CM

## 2014-06-29 DIAGNOSIS — I5030 Unspecified diastolic (congestive) heart failure: Secondary | ICD-10-CM

## 2014-06-29 DIAGNOSIS — Y95 Nosocomial condition: Secondary | ICD-10-CM

## 2014-06-29 DIAGNOSIS — R0902 Hypoxemia: Secondary | ICD-10-CM

## 2014-06-29 NOTE — Progress Notes (Signed)
Patient ID: Nicole Roach, female   DOB: 01/23/1922, 80 y.o.   MRN: [id number]    Patient ID: Nicole Roach, female   DOB: [birth year], 80 y.o.   MRN: [id number]  Nursing Home Location:  [facility]   Code Status: DNR with most form   Place of Service: SNF (31)  Chief Complaint  Patient presents with  . Acute Visit    f/u pna/hypoxia    HPI: 80 y.o.  Female residing at [facility], skilled care section. She was treated for PNA vs. CHF on 06/22/14 with Augmentin and increased lasix dosing to 80mg  daily. She was changed to Doxycycline on 06/25/14 due to non response to therapy.  She is doing some better, getting out of bed, less coughing, and has not needed any roxanol for body aches or SOB. However, she continues on oxygen at 2L [place] and Duoneb tx.    Review of Systems:  Review of Systems  Constitutional: Positive for chills, activity change, appetite change and fatigue. Negative for fever and diaphoresis.  HENT: Negative for congestion, postnasal drip, rhinorrhea, sinus pressure, sneezing, sore throat and trouble swallowing.   Eyes: Negative for pain, discharge and itching.  Respiratory: Positive for cough. Negative for choking, shortness of breath, wheezing and stridor.   Cardiovascular: Negative for chest pain, palpitations and leg swelling.  Gastrointestinal: Negative for abdominal pain, constipation and abdominal distention.  Genitourinary: Positive for hematuria. Negative for dysuria and difficulty urinating.  Skin: Negative for color change, pallor and rash.  Neurological: Positive for weakness. Negative for tremors, syncope, facial asymmetry, speech difficulty and light-headedness.  Psychiatric/Behavioral: Positive for confusion. Negative for behavioral problems and agitation.    Medications: Patient's Medications  New Prescriptions   No medications on file  Previous Medications   BRIMONIDINE-TIMOLOL (COMBIGAN) 0.2-0.5 % OPHTHALMIC  SOLUTION    Place 1 drop into the left eye 2 (two) times daily.   CHLORHEXIDINE (PERIDEX) 0.12 % SOLUTION    Use as directed 15 mLs in the mouth or throat 2 (two) times daily.   CHOLECALCIFEROL (VITAMIN D) 2000 UNITS TABLET    Take 2,000 Units by mouth daily.   DONEPEZIL (ARICEPT) 10 MG TABLET    Take 5 mg by mouth at bedtime.    FUROSEMIDE (LASIX) 40 MG TABLET    Take 60 mg by mouth daily.    IPRATROPIUM-ALBUTEROL (DUONEB) 0.5-2.5 (3) MG/3ML SOLN    Take 3 mLs by nebulization every 6 (six) hours.   MEMANTINE HCL ER (NAMENDA XR) 28 MG CP24    Take 1 capsule by mouth daily.   METOPROLOL SUCCINATE (TOPROL-XL) 25 MG 24 HR TABLET    Take 1 tablet (25 mg total) by mouth daily.   MULTIPLE VITAMINS-MINERALS (PRESERVISION AREDS PO)    Take 1 capsule by mouth daily.   NEPAFENAC (NEVANAC) 0.1 % OPHTHALMIC SUSPENSION    Place 1 drop into the left eye 3 (three) times daily.    POLYETHYLENE GLYCOL (MIRALAX / GLYCOLAX) PACKET    Take 17 g by mouth. 17gram in beverage of choice every other morning for constipation   POTASSIUM CHLORIDE (MICRO-K) 10 MEQ CR CAPSULE    Take 20 mEq by mouth daily.    SENNA (SENOKOT) 8.6 MG TABS    Take 1 tablet by mouth at bedtime.  Modified Medications   No medications on file  Discontinued Medications   No medications on file     Physical Exam:  Filed Vitals:   06/29/14 1221  BP: 126/68  Pulse: 72  Temp: 97.1 F (36.2 C)  Resp: 16  Weight: 186 lb 12.8 oz (84.732 kg)  SpO2: 94%    Physical Exam  Constitutional: No distress.  Frail elderly female  HENT:  Head: Normocephalic and atraumatic.  Eyes: Conjunctivae are normal. Pupils are equal, round, and reactive to light. Right eye exhibits no discharge. Left eye exhibits no discharge.  Neck: No JVD present. No tracheal deviation present.  Pulmonary/Chest: Effort normal. No respiratory distress. She has no wheezes. She has no rales.  bilat rhonchi and exp wheeze  Abdominal: Soft. Bowel sounds are normal. She  exhibits no distension. There is no tenderness.  Lymphadenopathy:    She has cervical adenopathy.  Neurological:  Alert to self, follows commands  Skin: Skin is warm and dry. No rash noted. She is not diaphoretic. No erythema.    Labs reviewed/Significant Diagnostic Results:  Basic Metabolic Panel:  Recent Labs  10/22/13 12/08/13 03/17/14  NA 139 143 141  K 4.2 3.8 4.1  BUN 25* 29* 26*  CREATININE 0.7 0.8 1.0   Liver Function Tests: No results for input(s): AST, ALT, ALKPHOS, BILITOT, PROT, ALBUMIN in the last 8760 hours. No results for input(s): LIPASE, AMYLASE in the last 8760 hours. No results for input(s): AMMONIA in the last 8760 hours. CBC:  Recent Labs  08/25/13 10/22/13 05/20/14  WBC 2.4 2.7 2.2  HGB 9.9* 10.8* 9.3*  HCT 29* 32* 27*  PLT 60* 73* 64*   CBG: No results for input(s): GLUCAP in the last 8760 hours. TSH:  Recent Labs  08/25/13  TSH 1.88   A1C: Lab Results  Component Value Date   HGBA1C * 11/02/2009    6.5 (NOTE)                                                                       According to the ADA Clinical Practice Recommendations for 2011, when HbA1c is used as a screening test:   >=6.5%   Diagnostic of Diabetes Mellitus           (if abnormal result  is confirmed)  5.7-6.4%   Increased risk of developing Diabetes Mellitus  References:Diagnosis and Classification of Diabetes Mellitus,Diabetes JYNW,2956,21(HYQMV 1):S62-S69 and Standards of Medical Care in         Diabetes - 2011,Diabetes Care,2011,34  (Suppl 1):S11-S61.     Assessment/Plan Hypoxia Improved but still requiring 2L of oxygen. She is more alert today and states that she feels better.  Continue oxygen and taper as tolerated  Congestive heart failure with LV diastolic dysfunction, NYHA class 1 Improved, no edema or SOB today.  She has lost 10-11 lbs partially due to the acute illness and also due to diuretic therapy. Decrease Lasix to 60 mg per today.  HCAP  (healthcare-associated pneumonia) The xray did not show an infiltrate but she presented with symptoms of pna with a cough, body aches, and hypoxia. Continue Doxycycline to completion.She is wheezing today, will continue scheduled Duonebs.    Labs/tests ordered BMP in am  Cindi Carbon, [place] [phone]

## 2014-06-29 NOTE — Assessment & Plan Note (Signed)
Improved but still requiring 2L of oxygen. She is more alert today and states that she feels better.  Continue oxygen and taper as tolerated

## 2014-06-29 NOTE — Assessment & Plan Note (Signed)
Improved, no edema or SOB today.  She has lost 10-11 lbs partially due to the acute illness and also due to diuretic therapy. Decrease Lasix to 60 mg per today.

## 2014-06-29 NOTE — Assessment & Plan Note (Addendum)
The xray did not show an infiltrate but she presented with symptoms of pna with a cough, body aches, and hypoxia. Continue Doxycycline to completion.She is wheezing today, will continue scheduled Duonebs.

## 2014-06-30 LAB — BASIC METABOLIC PANEL
BUN: 35 mg/dL — AB (ref 4–21)
CREATININE: 0.8 mg/dL (ref 0.5–1.1)
GLUCOSE: 105 mg/dL
POTASSIUM: 3.8 mmol/L (ref 3.4–5.3)
Sodium: 144 mmol/L (ref 137–147)

## 2014-07-02 ENCOUNTER — Non-Acute Institutional Stay (SKILLED_NURSING_FACILITY): Payer: No Typology Code available for payment source | Admitting: Internal Medicine

## 2014-07-02 ENCOUNTER — Encounter: Payer: Self-pay | Admitting: Internal Medicine

## 2014-07-02 DIAGNOSIS — G309 Alzheimer's disease, unspecified: Secondary | ICD-10-CM

## 2014-07-02 DIAGNOSIS — I10 Essential (primary) hypertension: Secondary | ICD-10-CM

## 2014-07-02 DIAGNOSIS — R0902 Hypoxemia: Secondary | ICD-10-CM

## 2014-07-02 DIAGNOSIS — F0280 Dementia in other diseases classified elsewhere without behavioral disturbance: Secondary | ICD-10-CM

## 2014-07-02 DIAGNOSIS — I5030 Unspecified diastolic (congestive) heart failure: Secondary | ICD-10-CM

## 2014-07-02 DIAGNOSIS — J209 Acute bronchitis, unspecified: Secondary | ICD-10-CM | POA: Insufficient documentation

## 2014-07-02 MED ORDER — DM-GUAIFENESIN ER 30-600 MG PO TB12: ORAL_TABLET | ORAL | Status: DC

## 2014-07-02 NOTE — Progress Notes (Addendum)
Patient ID: Nicole Roach, female   DOB: Aug 18, 1921, 80 y.o.   MRN: [id number]    [facility] Room Number: 126  Place of Service: SNF (31)   Allergies  Allergen Reactions  . Avelox [Moxifloxacin Hcl In Nacl]   . Levaquin [Levofloxacin In D5w]   . Quinolones   . Tetanus Toxoids     Chief Complaint  Patient presents with  . Acute Visit    Not eating, coughing, complains of "pain all over"    HPI:  Received reports from nursing about the patient having a persistent cough, complaints of "pain all over", and not eating well. She has a history of atrial fibrillation. She is not febrile. It is a toss up in my mind as to whether she has congestive heart failure or acute bronchitis. Because of the wet sounding cough tends to favor an acute bronchitis. She currently denies dyspnea or nausea or abdominal pain.  Medications: Patient's Medications  New Prescriptions   No medications on file  Previous Medications   BRIMONIDINE-TIMOLOL (COMBIGAN) 0.2-0.5 % OPHTHALMIC SOLUTION    Place 1 drop into the left eye 2 (two) times daily.   CHLORHEXIDINE (PERIDEX) 0.12 % SOLUTION    Use as directed 15 mLs in the mouth or throat 2 (two) times daily.   CHOLECALCIFEROL (VITAMIN D) 2000 UNITS TABLET    Take 2,000 Units by mouth daily.   DONEPEZIL (ARICEPT) 10 MG TABLET    Take 5 mg by mouth at bedtime.    FUROSEMIDE (LASIX) 40 MG TABLET    Take 60 mg by mouth daily.    IPRATROPIUM-ALBUTEROL (DUONEB) 0.5-2.5 (3) MG/3ML SOLN    Take 3 mLs by nebulization every 6 (six) hours.   MEMANTINE HCL ER (NAMENDA XR) 28 MG CP24    Take 1 capsule by mouth daily.   METOPROLOL SUCCINATE (TOPROL-XL) 25 MG 24 HR TABLET    Take 1 tablet (25 mg total) by mouth daily.   MULTIPLE VITAMINS-MINERALS (PRESERVISION AREDS PO)    Take 1 capsule by mouth daily.   NEPAFENAC (NEVANAC) 0.1 % OPHTHALMIC SUSPENSION    Place 1 drop into the left eye 3 (three) times daily.    POLYETHYLENE GLYCOL  (MIRALAX / GLYCOLAX) PACKET    Take 17 g by mouth. 17gram in beverage of choice every other morning for constipation   POTASSIUM CHLORIDE (MICRO-K) 10 MEQ CR CAPSULE    Take 20 mEq by mouth daily.    SENNA (SENOKOT) 8.6 MG TABS    Take 1 tablet by mouth at bedtime.  Modified Medications   No medications on file  Discontinued Medications   No medications on file     Review of Systems  Constitutional: Negative for fever, chills, activity change and appetite change.  HENT: Positive for congestion and rhinorrhea. Negative for ear pain, nosebleeds, sore throat and trouble swallowing.   Eyes: Negative.   Respiratory: Positive for cough and chest tightness. Negative for shortness of breath and wheezing.   Cardiovascular: Positive for leg swelling. Negative for chest pain and palpitations.  Gastrointestinal: Negative for abdominal pain, constipation and abdominal distention.  Endocrine: Negative.   Genitourinary: Negative for dysuria and hematuria.  Musculoskeletal: Positive for gait problem. Negative for back pain and arthralgias.  Skin: Negative for color change, pallor and rash.  Neurological: Negative for dizziness, seizures, speech difficulty and light-headedness.       Dementia  Hematological: Negative.   Psychiatric/Behavioral: Positive for confusion. Negative for behavioral problems and  agitation.    Filed Vitals:   06/25/14 1408  BP: 125/76  Pulse: 67  Temp: 97.1 F (36.2 C)  Resp: 16  SpO2: 93%   There is no weight on file to calculate BMI.  Physical Exam  Constitutional: She is oriented to person, place, and time. She appears well-developed and well-nourished. She appears distressed.  HENT:  Right Ear: External ear normal.  Left Ear: External ear normal.  Nose: Nose normal.  Mouth/Throat: Oropharynx is clear and moist. No oropharyngeal exudate.  Eyes: Conjunctivae and EOM are normal. Pupils are equal, round, and reactive to light. No scleral icterus.  Neck: No JVD  present. No tracheal deviation present. No thyromegaly present.  Cardiovascular: Normal rate, regular rhythm, normal heart sounds and intact distal pulses.  Exam reveals no gallop and no friction rub.   No murmur heard. Pulmonary/Chest: Effort normal. No respiratory distress. She has no wheezes. She has rales (Diffuse, bilateral). She exhibits no tenderness.  Abdominal: She exhibits no distension and no mass. There is no tenderness.  Musculoskeletal: Normal range of motion. She exhibits no edema or tenderness.  Generalized myalgias on palpation.  Lymphadenopathy:    She has no cervical adenopathy.  Neurological: She is alert and oriented to person, place, and time. No cranial nerve deficit. Coordination normal.  Mild dementia.  Skin: No rash noted. She is not diaphoretic. No erythema. No pallor.  Psychiatric: She has a normal mood and affect. Her behavior is normal.     Labs reviewed: Nursing Home on 06/21/2014  Component Date Value Ref Range Status  . Hemoglobin 05/20/2014 9.3* 12.0 - 16.0 g/dL Final  . HCT 05/20/2014 27* 36 - 46 % Final  . Platelets 05/20/2014 64* 150 - 399 K/L Final  . WBC 05/20/2014 2.2   Final  . BUN 03/17/2014 26* 4 - 21 mg/dL Final  . Creatinine 03/17/2014 1.0  .5 - 1.1 mg/dL Final  . Potassium 03/17/2014 4.1  3.4 - 5.3 mmol/L Final  . Sodium 03/17/2014 141  137 - 147 mmol/L Final  . Glucose 05/20/2014 129   Final     Assessment/Plan  1. Acute bronchitis, unspecified organism -Portable chest x-ray -Start doxycycline 100 mg twice daily for 10 days -CBC, CMP  2. Alzheimer's disease Obtaining history is difficult in this demented patient. She maintains some conversational abilities, but is feeling very poorly.  3. Essential hypertension Controlled

## 2014-07-02 NOTE — Progress Notes (Addendum)
Patient ID: Nicole Roach, female   DOB: Feb 10, 1922, 80 y.o.   MRN: [id number]    [facility] Room Number: 126  Place of Service: SNF (31)   Allergies  Allergen Reactions  . Avelox [Moxifloxacin Hcl In Nacl]   . Levaquin [Levofloxacin In D5w]   . Quinolones   . Tetanus Toxoids     Chief Complaint  Patient presents with  . Acute Visit    Follow-up respiratory issues. Continued cough and not eating well    HPI:  Acute bronchitis, unspecified organism: I last saw the patient has some concern whether she had congestive heart failure or an acute bronchitis. She may actually have both problems. She has been diuresed several pounds and portable chest x-ray suggests possible congestive heart failure. On the portable chest x-ray, there is not a definite infiltrate, but she has been hypoxic, wheezing, and with diffuse rales. She is due to finish her antibiotics in a few days. She does not appear toxic today. She is not eating very well yet. She denies pain or discomfort, which she did complain of on the last visit with me.  Congestive heart failure with LV diastolic dysfunction, NYHA class 1: She has responded to diuretics with a loss of at least 11 pounds and appears to be breathing easier.  Alzheimer's disease: Unchanged  Essential hypertension: Controlled  Hypoxia: Continues on oxygen supplementation    Medications: Patient's Medications  New Prescriptions   No medications on file  Previous Medications   BRIMONIDINE-TIMOLOL (COMBIGAN) 0.2-0.5 % OPHTHALMIC SOLUTION    Place 1 drop into the left eye 2 (two) times daily.   CHLORHEXIDINE (PERIDEX) 0.12 % SOLUTION    Use as directed 15 mLs in the mouth or throat 2 (two) times daily.   CHOLECALCIFEROL (VITAMIN D) 2000 UNITS TABLET    Take 2,000 Units by mouth daily.   DONEPEZIL (ARICEPT) 10 MG TABLET    Take 5 mg by mouth at bedtime.    FUROSEMIDE (LASIX) 40 MG TABLET    Take 60 mg by mouth  daily.    IPRATROPIUM-ALBUTEROL (DUONEB) 0.5-2.5 (3) MG/3ML SOLN    Take 3 mLs by nebulization every 6 (six) hours.   MEMANTINE HCL ER (NAMENDA XR) 28 MG CP24    Take 1 capsule by mouth daily.   METOPROLOL SUCCINATE (TOPROL-XL) 25 MG 24 HR TABLET    Take 1 tablet (25 mg total) by mouth daily.   MULTIPLE VITAMINS-MINERALS (PRESERVISION AREDS PO)    Take 1 capsule by mouth daily.   NEPAFENAC (NEVANAC) 0.1 % OPHTHALMIC SUSPENSION    Place 1 drop into the left eye 3 (three) times daily.    POLYETHYLENE GLYCOL (MIRALAX / GLYCOLAX) PACKET    Take 17 g by mouth. 17gram in beverage of choice every other morning for constipation   POTASSIUM CHLORIDE (MICRO-K) 10 MEQ CR CAPSULE    Take 20 mEq by mouth daily.    SENNA (SENOKOT) 8.6 MG TABS    Take 1 tablet by mouth at bedtime.  Modified Medications   No medications on file  Discontinued Medications   No medications on file     Review of Systems  Constitutional: Negative for fever, chills, activity change and appetite change.  HENT: Positive for congestion and rhinorrhea. Negative for ear pain, nosebleeds, sore throat and trouble swallowing.   Eyes: Negative.   Respiratory: Positive for cough and chest tightness. Negative for shortness of breath and wheezing.   Cardiovascular: Positive for leg  swelling. Negative for chest pain and palpitations.  Gastrointestinal: Negative for abdominal pain, constipation and abdominal distention.  Endocrine: Negative.   Genitourinary: Negative for dysuria and hematuria.  Musculoskeletal: Positive for gait problem. Negative for back pain and arthralgias.  Skin: Negative for color change, pallor and rash.  Neurological: Negative for dizziness, seizures, speech difficulty and light-headedness.       Dementia  Hematological: Negative.   Psychiatric/Behavioral: Positive for confusion. Negative for behavioral problems and agitation.    Filed Vitals:   07/02/14 1419  BP: 116/63  Pulse: 82  Temp: 97.3 F (36.3 C)    Resp: 16  Height: 5\' 7"  (1.702 m)  Weight: 188 lb 6.4 oz (85.458 kg)  SpO2: 97%   Body mass index is 29.5 kg/(m^2).  Physical Exam  Constitutional: She is oriented to person, place, and time. She appears well-developed and well-nourished. She appears distressed.  HENT:  Right Ear: External ear normal.  Left Ear: External ear normal.  Nose: Nose normal.  Mouth/Throat: Oropharynx is clear and moist. No oropharyngeal exudate.  Eyes: Conjunctivae and EOM are normal. Pupils are equal, round, and reactive to light. No scleral icterus.  Neck: No JVD present. No tracheal deviation present. No thyromegaly present.  Cardiovascular: Normal rate, regular rhythm, normal heart sounds and intact distal pulses.  Exam reveals no gallop and no friction rub.   No murmur heard. Pulmonary/Chest: Effort normal. No respiratory distress. She has no wheezes. She has rales (Diffuse, bilateral). She exhibits no tenderness.  Abdominal: She exhibits no distension and no mass. There is no tenderness.  Musculoskeletal: Normal range of motion. She exhibits no edema or tenderness.  Generalized myalgias on palpation.  Lymphadenopathy:    She has no cervical adenopathy.  Neurological: She is alert and oriented to person, place, and time. No cranial nerve deficit. Coordination normal.  Mild dementia.  Skin: No rash noted. She is not diaphoretic. No erythema. No pallor.  Psychiatric: She has a normal mood and affect. Her behavior is normal.     Labs reviewed: Nursing Home on 06/21/2014  Component Date Value Ref Range Status  . Hemoglobin 05/20/2014 9.3* 12.0 - 16.0 g/dL Final  . HCT 05/20/2014 27* 36 - 46 % Final  . Platelets 05/20/2014 64* 150 - 399 K/L Final  . WBC 05/20/2014 2.2   Final  . BUN 03/17/2014 26* 4 - 21 mg/dL Final  . Creatinine 03/17/2014 1.0  .5 - 1.1 mg/dL Final  . Potassium 03/17/2014 4.1  3.4 - 5.3 mmol/L Final  . Sodium 03/17/2014 141  137 - 147 mmol/L Final  . Glucose 05/20/2014 129    Final   Chest x-ray: No infiltrate. Possible congestive heart failure.  Assessment/Plan 1. Acute bronchitis, unspecified organism L2 to whether the latest problem was sinus or bronchitis complicated congestive heart failure - dextromethorphan-guaiFENesin (MUCINEX DM) 30-600 MG per 12 hr tablet; 1 every 12 hours to help cough and congestion  Dispense: 20 tablet; Refill: 0  2. Congestive heart failure with LV diastolic dysfunction, NYHA class 1 Improved  3. Alzheimer's disease Unchanged  4. Essential hypertension Stable  5. Hypoxia Supplement with oxygen as needed

## 2014-07-20 ENCOUNTER — Non-Acute Institutional Stay (SKILLED_NURSING_FACILITY): Payer: No Typology Code available for payment source | Admitting: Adult Health

## 2014-07-20 ENCOUNTER — Encounter: Payer: Self-pay | Admitting: Adult Health

## 2014-07-20 DIAGNOSIS — J209 Acute bronchitis, unspecified: Secondary | ICD-10-CM

## 2014-07-20 DIAGNOSIS — I482 Chronic atrial fibrillation, unspecified: Secondary | ICD-10-CM

## 2014-07-20 DIAGNOSIS — I10 Essential (primary) hypertension: Secondary | ICD-10-CM

## 2014-07-20 DIAGNOSIS — D61818 Other pancytopenia: Secondary | ICD-10-CM

## 2014-07-20 DIAGNOSIS — I5030 Unspecified diastolic (congestive) heart failure: Secondary | ICD-10-CM

## 2014-07-20 DIAGNOSIS — F0280 Dementia in other diseases classified elsewhere without behavioral disturbance: Secondary | ICD-10-CM

## 2014-07-20 DIAGNOSIS — G309 Alzheimer's disease, unspecified: Secondary | ICD-10-CM

## 2014-07-20 NOTE — Assessment & Plan Note (Signed)
Improved, 02 sats normal, but continues with a congested cough. No fevers, eating well. Begin Mucinex 1 tab BID scheduled

## 2014-07-20 NOTE — Progress Notes (Signed)
Patient ID: Nicole Roach, female   DOB: 07-Apr-1922, 80 y.o.   MRN: [id number]  Nursing Home Location:  [facility]   Code Status: DNR with comfort measures   Place of Service: SNF (31)  Chief Complaint  Patient presents with  . Medical Management of Chronic Issues    HPI:  80 y.o. female residing at [facility], skilled care section. I am here to review her chronic medical issues.  VS have been stable over the past month. Weight has remained stable at 190 lbs.She was treated for a respiratory infection with Doxycycline for 10 days in December, and a congested cough remains. However, she is eating better and has not had any fevers, and has normal sats off oxygen. She currently uses the [facility] for mobility and is incontinent of urine. She has a hx of pancytopenia and blood in her urine but given her age, debility, and dementia, her POA has chosen not to work these issues up.  The blood in her urine is intermittent per the staff. Her WBC, Hgb and plt count have drifted down over time.  Review of Systems:  Review of Systems  Constitutional: Negative for fever, chills, activity change and appetite change.  HENT: Positive for congestion and rhinorrhea. Negative for ear pain, nosebleeds, sore throat and trouble swallowing.   Respiratory: Positive for cough. Negative for shortness of breath and wheezing.   Cardiovascular: Positive for leg swelling. Negative for chest pain and palpitations.  Gastrointestinal: Negative for abdominal pain, constipation and abdominal distention.  Genitourinary: Positive for hematuria. Negative for dysuria.  Musculoskeletal: Positive for gait problem. Negative for back pain and arthralgias.  Skin: Negative for color change and rash.  Neurological: Negative for dizziness, seizures, speech difficulty and light-headedness.  Psychiatric/Behavioral: Positive for confusion. Negative for behavioral problems and agitation.     Medications: Patient's Medications  New Prescriptions   No medications on file  Previous Medications   BRIMONIDINE-TIMOLOL (COMBIGAN) 0.2-0.5 % OPHTHALMIC SOLUTION    Place 1 drop into the left eye 2 (two) times daily.   CHLORHEXIDINE (PERIDEX) 0.12 % SOLUTION    Use as directed 15 mLs in the mouth or throat 2 (two) times daily.   CHOLECALCIFEROL (VITAMIN D) 2000 UNITS TABLET    Take 2,000 Units by mouth daily.   DEXTROMETHORPHAN-GUAIFENESIN (MUCINEX DM) 30-600 MG PER 12 HR TABLET    1 every 12 hours to help cough and congestion   DONEPEZIL (ARICEPT) 10 MG TABLET    Take 5 mg by mouth at bedtime.    FUROSEMIDE (LASIX) 40 MG TABLET    Take 60 mg by mouth daily.    IPRATROPIUM-ALBUTEROL (DUONEB) 0.5-2.5 (3) MG/3ML SOLN    Take 3 mLs by nebulization every 6 (six) hours.   MEMANTINE HCL ER (NAMENDA XR) 28 MG CP24    Take 1 capsule by mouth daily.   METOPROLOL SUCCINATE (TOPROL-XL) 25 MG 24 HR TABLET    Take 1 tablet (25 mg total) by mouth daily.   MULTIPLE VITAMINS-MINERALS (PRESERVISION AREDS PO)    Take 1 capsule by mouth daily.   NEPAFENAC (NEVANAC) 0.1 % OPHTHALMIC SUSPENSION    Place 1 drop into the left eye 3 (three) times daily.    POLYETHYLENE GLYCOL (MIRALAX / GLYCOLAX) PACKET    Take 17 g by mouth. 17gram in beverage of choice every other morning for constipation   POTASSIUM CHLORIDE (MICRO-K) 10 MEQ CR CAPSULE    Take 20 mEq by mouth daily.  SENNA (SENOKOT) 8.6 MG TABS    Take 1 tablet by mouth at bedtime.  Modified Medications   No medications on file  Discontinued Medications   No medications on file     Physical Exam:  Filed Vitals:   07/20/14 1222  BP: 113/62  Pulse: 71  Temp: 97.6 F (36.4 C)  Resp: 17  Weight: 190 lb 3.2 oz (86.274 kg)  SpO2: 96%    Physical Exam  Constitutional: No distress.  frail  HENT:  Head: Normocephalic.  Mouth/Throat: No oropharyngeal exudate.  Eyes: Conjunctivae are normal. Pupils are equal, round, and reactive to light.  Right eye exhibits no discharge. Left eye exhibits no discharge.  Neck: No JVD present. No tracheal deviation present. No thyromegaly present.  Cardiovascular: Normal rate.  An irregularly irregular rhythm present.  No murmur heard. bilat lower ext edema +1  Pulmonary/Chest: Effort normal. No respiratory distress. She has wheezes.  Abdominal: Soft. Bowel sounds are normal. She exhibits no distension. There is no tenderness.  Lymphadenopathy:    She has no cervical adenopathy.  Neurological: She is alert. No cranial nerve deficit.  Oriented to self and situation. Able to answer q's but poor historian. Able to f/c  Skin: Skin is warm and dry. She is not diaphoretic.  Psychiatric: Affect normal.    Labs reviewed/Significant Diagnostic Results:  Basic Metabolic Panel:  Recent Labs  12/08/13 03/17/14 06/30/14  NA 143 141 144  K 3.8 4.1 3.8  BUN 29* 26* 35*  CREATININE 0.8 1.0 0.8   Liver Function Tests: No results for input(s): AST, ALT, ALKPHOS, BILITOT, PROT, ALBUMIN in the last 8760 hours. No results for input(s): LIPASE, AMYLASE in the last 8760 hours. No results for input(s): AMMONIA in the last 8760 hours. CBC:  Recent Labs  08/25/13 10/22/13 05/20/14  WBC 2.4 2.7 2.2  HGB 9.9* 10.8* 9.3*  HCT 29* 32* 27*  PLT 60* 73* 64*   CBG: No results for input(s): GLUCAP in the last 8760 hours. TSH:  Recent Labs  08/25/13  TSH 1.88   A1C: Lab Results  Component Value Date   HGBA1C * 11/02/2009    6.5 (NOTE)                                                                       According to the ADA Clinical Practice Recommendations for 2011, when HbA1c is used as a screening test:   >=6.5%   Diagnostic of Diabetes Mellitus           (if abnormal result  is confirmed)  5.7-6.4%   Increased risk of developing Diabetes Mellitus  References:Diagnosis and Classification of Diabetes Mellitus,Diabetes YFRT,0211,17(BVAPO 1):S62-S69 and Standards of Medical Care in         Diabetes -  2011,Diabetes LIDC,3013,14  (Suppl 1):S11-S61.   05/20/14: Iron 81, TIBC 212, %sat 38, B12 364, Folate 11.5, ferritin 292   Assessment/Plan  Acute bronchitis Improved, 02 sats normal, but continues with a congested cough. No fevers, eating well. Begin Mucinex 1 tab BID scheduled  Alzheimer's disease Progressive decline over time. Last MMSE 11/30.  Current on Aricept and Namenda. She continues to be able to communicate and feed herself. Continues meds for any functional benefit they may be  providing.  ATRIAL FIBRILLATION, PAROXYSMAL Rate controlled, not on coumadin due to anemia. Continue current meds and monitor.  Congestive heart failure with LV diastolic dysfunction, NYHA class 1 Weight stabilizing at 190lbs, some edema in legs persist, no SOB. Continue Lasix 60mg  daily. Recheck BMP in am  Essential hypertension Controlled on current regimen, continue and monitor.  Other pancytopenia Worsening, no RX per POA. Check CBC in am.     Cindi Carbon, ANP [facility] [phone]

## 2014-07-20 NOTE — Assessment & Plan Note (Signed)
Progressive decline over time. Last MMSE 11/30.  Current on Aricept and Namenda. She continues to be able to communicate and feed herself. Continues meds for any functional benefit they may be providing.

## 2014-07-20 NOTE — Assessment & Plan Note (Signed)
Worsening, no RX per POA. Check CBC in am.

## 2014-07-20 NOTE — Assessment & Plan Note (Signed)
Controlled on current regimen, continue and monitor.

## 2014-07-20 NOTE — Assessment & Plan Note (Signed)
Rate controlled, not on coumadin due to anemia. Continue current meds and monitor.

## 2014-07-20 NOTE — Assessment & Plan Note (Signed)
Weight stabilizing at 190lbs, some edema in legs persist, no SOB. Continue Lasix 60mg  daily. Recheck BMP in am

## 2014-07-21 LAB — BASIC METABOLIC PANEL
BUN: 22 mg/dL — AB (ref 4–21)
CREATININE: 0.7 mg/dL (ref 0.5–1.1)
Glucose: 103 mg/dL
POTASSIUM: 4.3 mmol/L (ref 3.4–5.3)
SODIUM: 140 mmol/L (ref 137–147)

## 2014-07-28 ENCOUNTER — Encounter: Payer: Self-pay | Admitting: Internal Medicine

## 2014-07-28 ENCOUNTER — Non-Acute Institutional Stay (SKILLED_NURSING_FACILITY): Payer: No Typology Code available for payment source | Admitting: Internal Medicine

## 2014-07-28 DIAGNOSIS — R319 Hematuria, unspecified: Secondary | ICD-10-CM

## 2014-07-28 DIAGNOSIS — R053 Chronic cough: Secondary | ICD-10-CM

## 2014-07-28 DIAGNOSIS — D61818 Other pancytopenia: Secondary | ICD-10-CM

## 2014-07-28 DIAGNOSIS — R05 Cough: Secondary | ICD-10-CM

## 2014-07-28 LAB — CBC AND DIFFERENTIAL
HCT: 25 % — AB (ref 36–46)
Hemoglobin: 9.1 g/dL — AB (ref 12.0–16.0)
Platelets: 64 10*3/uL — AB (ref 150–399)
WBC: 2.5 10^3/mL

## 2014-08-01 NOTE — Progress Notes (Signed)
Patient ID: Nicole Roach, female   DOB: 1921-09-08, 81 y.o.   MRN: [id number]   Date:  07/28/13 Location:  [facility] Provider:  Aarik Blank L. Mariea Clonts, D.O., C.M.D.  Code Status:  DNR, palliative care  Chief Complaint  Patient presents with  . Medical Management of Chronic Issues    HPI:  81 yo female with h/o hematuria, chronic anemia in the 9 range (unchanged last check), thrombocytopenia to 64 (also unchanged), chronic congested cough seen for acute visit due to platelets of 64.   She had no complaints--saying she is ok or "I don't know" to questions when asked about her cough and vaginal bleeding..."I don't know, ask my aide".    She denies pain.  She was resting in her recliner chair when seen.  Review of Systems:  Review of Systems  Constitutional: Positive for malaise/fatigue. Negative for fever and chills.  HENT: Negative for congestion.   Respiratory: Negative for shortness of breath.   Cardiovascular: Negative for chest pain.  Gastrointestinal: Negative for abdominal pain, blood in stool and melena.  Genitourinary: Negative for dysuria.       No recent significant amounts of vaginal bleeding noted  Musculoskeletal: Negative for myalgias and falls.  Skin: Negative for rash.  Neurological: Positive for weakness. Negative for dizziness.  Endo/Heme/Allergies: Bruises/bleeds easily.  Psychiatric/Behavioral: Positive for memory loss.    Medications: Patient's Medications  New Prescriptions   No medications on file  Previous Medications   BRIMONIDINE-TIMOLOL (COMBIGAN) 0.2-0.5 % OPHTHALMIC SOLUTION    Place 1 drop into the left eye 2 (two) times daily.   CHLORHEXIDINE (PERIDEX) 0.12 % SOLUTION    Use as directed 15 mLs in the mouth or throat 2 (two) times daily.   CHOLECALCIFEROL (VITAMIN D) 2000 UNITS TABLET    Take 2,000 Units by mouth daily.   DEXTROMETHORPHAN-GUAIFENESIN (MUCINEX DM) 30-600 MG PER 12 HR TABLET    1 every 12 hours to help cough and congestion   DONEPEZIL (ARICEPT) 10 MG TABLET    Take 5 mg by mouth at bedtime.    FUROSEMIDE (LASIX) 40 MG TABLET    Take 60 mg by mouth daily.    IPRATROPIUM-ALBUTEROL (DUONEB) 0.5-2.5 (3) MG/3ML SOLN    Take 3 mLs by nebulization every 6 (six) hours.   MEMANTINE HCL ER (NAMENDA XR) 28 MG CP24    Take 1 capsule by mouth daily.   METOPROLOL SUCCINATE (TOPROL-XL) 25 MG 24 HR TABLET    Take 1 tablet (25 mg total) by mouth daily.   MULTIPLE VITAMINS-MINERALS (PRESERVISION AREDS PO)    Take 1 capsule by mouth daily.   NEPAFENAC (NEVANAC) 0.1 % OPHTHALMIC SUSPENSION    Place 1 drop into the left eye 3 (three) times daily.    POLYETHYLENE GLYCOL (MIRALAX / GLYCOLAX) PACKET    Take 17 g by mouth. 17gram in beverage of choice every other morning for constipation   POTASSIUM CHLORIDE (MICRO-K) 10 MEQ CR CAPSULE    Take 20 mEq by mouth daily.    SENNA (SENOKOT) 8.6 MG TABS    Take 1 tablet by mouth at bedtime.  Modified Medications   No medications on file  Discontinued Medications   No medications on file    Physical Exam: Filed Vitals:   07/28/14 1157  BP: 117/60  Pulse: 67  Temp: 97.9 F (36.6 C)  Resp: 16  Weight: 192 lb 6.4 oz (87.272 kg)  SpO2: 92%  Physical Exam  Constitutional: No distress.  Resting comfortably in  recliner chair  Cardiovascular: Normal rate, regular rhythm, normal heart sounds and intact distal pulses.   Pulmonary/Chest: Effort normal.  Coarse wet rhonchi  Abdominal: Soft. Bowel sounds are normal. She exhibits no distension and no mass. There is no tenderness.  Neurological: She is alert.  Skin: Skin is warm and dry. There is pallor.  Psychiatric: She has a normal mood and affect.    Labs reviewed: Basic Metabolic Panel:  Recent Labs  12/08/13 03/17/14 06/30/14  NA 143 141 144  K 3.8 4.1 3.8  BUN 29* 26* 35*  CREATININE 0.8 1.0 0.8    Liver Function Tests: No results for input(s): AST, ALT, ALKPHOS, BILITOT, PROT, ALBUMIN in the last 8760 hours.  CBC:  Recent  Labs  08/25/13 10/22/13 05/20/14  WBC 2.4 2.7 2.2  HGB 9.9* 10.8* 9.3*  HCT 29* 32* 27*  PLT 60* 73* 64*    Assessment/Plan 1. Other pancytopenia -anemia stable in 9s, platelets stable in 60s, wbc in 2 range -she is suspected of having malignancy, possibly bladder ca, but, due to her dementia and poor overall prognosis, further workup has been avoided  -she may also have myelodysyplastic syndrome at her age with these lab findings  2. Chronic cough -she is back on mucinex for this congestion -I suspect she has some recurrent aspiration as the etiology  3. Blood in urine -may have bladder ca, but goals of care are palliative at this time due to her late stage dementia with last mmse 11/30   Family/ staff Communication: discussed with supervisor, NP, and resident  Goals of care: palliative care--at next routine, recommend discussion with her HCPOA to move toward hospice care at this time (apparently was discussed previously and then she had some improvements, but has since deteriorated)

## 2014-08-03 ENCOUNTER — Non-Acute Institutional Stay (SKILLED_NURSING_FACILITY): Payer: No Typology Code available for payment source | Admitting: Adult Health

## 2014-08-03 ENCOUNTER — Encounter: Payer: Self-pay | Admitting: Adult Health

## 2014-08-03 DIAGNOSIS — J209 Acute bronchitis, unspecified: Secondary | ICD-10-CM

## 2014-08-03 NOTE — Assessment & Plan Note (Signed)
Continues with cough, may have aspiration risk. Lungs sounds better today and she continues to be afebrile. Latest PCXR unremarkable. Continue Robitussin BID.  Will order speech eval

## 2014-08-03 NOTE — Progress Notes (Signed)
Patient ID: Nicole Roach, female   DOB: [birth year], 81 y.o.   MRN: [id number]  Nursing Home Location:  [facility]   Code Status: DNR with comfort measures   Place of Service: SNF (31)  Chief Complaint  Patient presents with  . Acute Visit    f/u cough    HPI:  81 y.o. female residing at [facility], skilled care section. I am here to re evaluate her chronic cough. The staff reports that it has not changed but she is not requiring oxygen now and has been afebrile. She was treated for a respiratory infection with Doxycycline for 10 days in December, and a congested cough remains.  She currently uses the [facility] for mobility and is incontinent of urine. She has a hx of pancytopenia and blood in her urine but given her age, debility, and dementia, her POA has chosen not to work these issues up.  The blood in her urine is intermittent per the staff. Her WBC, Hgb and plt count have drifted down over time.  Review of Systems:  Review of Systems  Constitutional: Negative for fever, chills, activity change and appetite change.  HENT: Positive for congestion and rhinorrhea. Negative for ear pain, nosebleeds, sore throat and trouble swallowing.   Respiratory: Positive for cough. Negative for shortness of breath and wheezing.   Cardiovascular: Positive for leg swelling. Negative for chest pain and palpitations.  Gastrointestinal: Negative for abdominal pain, constipation and abdominal distention.  Genitourinary: Positive for hematuria. Negative for dysuria.  Musculoskeletal: Positive for gait problem. Negative for back pain and arthralgias.  Skin: Negative for color change and rash.  Neurological: Negative for dizziness, seizures, speech difficulty and light-headedness.  Psychiatric/Behavioral: Positive for confusion. Negative for behavioral problems and agitation.    Medications: Patient's Medications  New Prescriptions   No medications on file    Previous Medications   BRIMONIDINE-TIMOLOL (COMBIGAN) 0.2-0.5 % OPHTHALMIC SOLUTION    Place 1 drop into the left eye 2 (two) times daily.   CHLORHEXIDINE (PERIDEX) 0.12 % SOLUTION    Use as directed 15 mLs in the mouth or throat 2 (two) times daily.   CHOLECALCIFEROL (VITAMIN D) 2000 UNITS TABLET    Take 2,000 Units by mouth daily.   DEXTROMETHORPHAN-GUAIFENESIN (MUCINEX DM) 30-600 MG PER 12 HR TABLET    1 every 12 hours to help cough and congestion   DONEPEZIL (ARICEPT) 10 MG TABLET    Take 5 mg by mouth at bedtime.    FUROSEMIDE (LASIX) 40 MG TABLET    Take 60 mg by mouth daily.    IPRATROPIUM-ALBUTEROL (DUONEB) 0.5-2.5 (3) MG/3ML SOLN    Take 3 mLs by nebulization every 6 (six) hours.   MEMANTINE HCL ER (NAMENDA XR) 28 MG CP24    Take 1 capsule by mouth daily.   METOPROLOL SUCCINATE (TOPROL-XL) 25 MG 24 HR TABLET    Take 1 tablet (25 mg total) by mouth daily.   MULTIPLE VITAMINS-MINERALS (PRESERVISION AREDS PO)    Take 1 capsule by mouth daily.   NEPAFENAC (NEVANAC) 0.1 % OPHTHALMIC SUSPENSION    Place 1 drop into the left eye 3 (three) times daily.    POLYETHYLENE GLYCOL (MIRALAX / GLYCOLAX) PACKET    Take 17 g by mouth. 17gram in beverage of choice every other morning for constipation   POTASSIUM CHLORIDE (MICRO-K) 10 MEQ CR CAPSULE    Take 20 mEq by mouth daily.    SENNA (SENOKOT) 8.6 MG TABS  Take 1 tablet by mouth at bedtime.  Modified Medications   No medications on file  Discontinued Medications   No medications on file     Physical Exam:  Filed Vitals:   08/03/14 1226  BP: 117/60  Pulse: 67  Temp: 97.1 F (36.2 C)  Resp: 16  Weight: 190 lb 6.4 oz (86.365 kg)  SpO2: 90%    Physical Exam  Constitutional: No distress.  frail  HENT:  Head: Normocephalic.  Mouth/Throat: No oropharyngeal exudate.  Eyes: Conjunctivae are normal. Pupils are equal, round, and reactive to light. Right eye exhibits no discharge. Left eye exhibits no discharge.  Neck: No JVD present. No  tracheal deviation present. No thyromegaly present.  Cardiovascular: Normal rate.  An irregularly irregular rhythm present.  No murmur heard. bilat lower ext edema +1  Pulmonary/Chest: Effort normal. No respiratory distress. She has no wheezes.  Abdominal: Soft. Bowel sounds are normal. She exhibits no distension. There is no tenderness.  Lymphadenopathy:    She has no cervical adenopathy.  Neurological: She is alert. No cranial nerve deficit.  Oriented to self and situation. Able to answer q's but poor historian. Able to f/c  Skin: Skin is warm and dry. She is not diaphoretic.  Psychiatric: Affect normal.    Labs reviewed/Significant Diagnostic Results:  Basic Metabolic Panel:  Recent Labs  12/08/13 03/17/14 06/30/14  NA 143 141 144  K 3.8 4.1 3.8  BUN 29* 26* 35*  CREATININE 0.8 1.0 0.8   Liver Function Tests: No results for input(s): AST, ALT, ALKPHOS, BILITOT, PROT, ALBUMIN in the last 8760 hours. No results for input(s): LIPASE, AMYLASE in the last 8760 hours. No results for input(s): AMMONIA in the last 8760 hours. CBC:  Recent Labs  10/22/13 05/20/14 07/28/14  WBC 2.7 2.2 2.5  HGB 10.8* 9.3* 9.1*  HCT 32* 27* 25*  PLT 73* 64* 64*   CBG: No results for input(s): GLUCAP in the last 8760 hours. TSH:  Recent Labs  08/25/13  TSH 1.88   A1C: Lab Results  Component Value Date   HGBA1C * 11/02/2009    6.5 (NOTE)                                                                       According to the ADA Clinical Practice Recommendations for 2011, when HbA1c is used as a screening test:   >=6.5%   Diagnostic of Diabetes Mellitus           (if abnormal result  is confirmed)  5.7-6.4%   Increased risk of developing Diabetes Mellitus  References:Diagnosis and Classification of Diabetes Mellitus,Diabetes WPVX,4801,65(VVZSM 1):S62-S69 and Standards of Medical Care in         Diabetes - 2011,Diabetes OLMB,8675,44  (Suppl 1):S11-S61.   05/20/14: Iron 81, TIBC 212, %sat 38,  B12 364, Folate 11.5, ferritin 292   Assessment/Plan  Acute bronchitis Continues with cough, may have aspiration risk. Lungs sounds better today and she continues to be afebrile. Latest PCXR unremarkable. Continue Robitussin BID.  Will order speech eval     Cindi Carbon, ANP [facility] [phone]

## 2014-09-09 ENCOUNTER — Encounter: Payer: Self-pay | Admitting: Adult Health

## 2014-09-09 ENCOUNTER — Non-Acute Institutional Stay (SKILLED_NURSING_FACILITY): Payer: No Typology Code available for payment source | Admitting: Adult Health

## 2014-09-09 DIAGNOSIS — I482 Chronic atrial fibrillation, unspecified: Secondary | ICD-10-CM

## 2014-09-09 DIAGNOSIS — I10 Essential (primary) hypertension: Secondary | ICD-10-CM

## 2014-09-09 DIAGNOSIS — I5030 Unspecified diastolic (congestive) heart failure: Secondary | ICD-10-CM

## 2014-09-09 DIAGNOSIS — K5901 Slow transit constipation: Secondary | ICD-10-CM

## 2014-09-09 DIAGNOSIS — G309 Alzheimer's disease, unspecified: Secondary | ICD-10-CM

## 2014-09-09 DIAGNOSIS — D61818 Other pancytopenia: Secondary | ICD-10-CM

## 2014-09-09 DIAGNOSIS — R319 Hematuria, unspecified: Secondary | ICD-10-CM

## 2014-09-09 DIAGNOSIS — F0280 Dementia in other diseases classified elsewhere without behavioral disturbance: Secondary | ICD-10-CM

## 2014-09-09 DIAGNOSIS — R1314 Dysphagia, pharyngoesophageal phase: Secondary | ICD-10-CM

## 2014-09-09 NOTE — Progress Notes (Signed)
Patient ID: Nicole Roach, female   DOB: January 26, 1922, 81 y.o.   MRN: [id number]     Nursing Home Location:  [facility]   Code Status: DNR with comfort measures   Place of Service: SNF (31)  Chief Complaint  Patient presents with  . Medical Management of Chronic Issues    HPI:  81 y.o. female residing at [facility], skilled care section. I am here to review her chronic medical issues. She has a hx of AD, CHF diastolic, HTN, afib, pancytopenia and hematuria. She was treated for bronchitis in December of 2015 and required oxygen. She had a protracted illness and was dependent on oxygen, but has since weaned off of oxygen and her cough has subsided. She was evaluated by speech during that period and changed to mech soft diet with NTL and this seems to have helped her symptoms of cough and most likely, aspiration. Her weight is stable at 189 lbs. There are no new bouts of hematuria. She has a pancytopenia, possibly due to myelodysplastic syndrome. We are not working this up, her goals of care comfort. Her WBC count was 2.5, Plts were 64, and Hgb was 9.1 on 07/28/14.  This has been fairly stable.  Review of Systems:  Review of Systems  Constitutional: Negative for fever, chills, activity change and appetite change.  HENT: Negative for congestion, ear pain, nosebleeds, rhinorrhea, sore throat and trouble swallowing.   Respiratory: Negative for cough, shortness of breath and wheezing.   Cardiovascular: Positive for leg swelling. Negative for chest pain and palpitations.  Gastrointestinal: Negative for abdominal pain, constipation and abdominal distention.  Genitourinary: Negative for dysuria and hematuria.  Musculoskeletal: Positive for gait problem. Negative for back pain and arthralgias.  Skin: Negative for color change and rash.  Neurological: Negative for dizziness, seizures, speech difficulty and light-headedness.  Psychiatric/Behavioral: Positive  for confusion. Negative for behavioral problems and agitation. The patient is not nervous/anxious.        Memory loss    Medications: Patient's Medications  New Prescriptions   No medications on file  Previous Medications   BRIMONIDINE-TIMOLOL (COMBIGAN) 0.2-0.5 % OPHTHALMIC SOLUTION    Place 1 drop into the left eye 2 (two) times daily.   CHLORHEXIDINE (PERIDEX) 0.12 % SOLUTION    Use as directed 15 mLs in the mouth or throat 2 (two) times daily.   CHOLECALCIFEROL (VITAMIN D) 2000 UNITS TABLET    Take 2,000 Units by mouth daily.   DONEPEZIL (ARICEPT) 10 MG TABLET    Take 5 mg by mouth at bedtime.    FUROSEMIDE (LASIX) 40 MG TABLET    Take 60 mg by mouth daily.    IPRATROPIUM-ALBUTEROL (DUONEB) 0.5-2.5 (3) MG/3ML SOLN    Take 3 mLs by nebulization every 6 (six) hours.   MEMANTINE HCL ER (NAMENDA XR) 28 MG CP24    Take 1 capsule by mouth daily.   METOPROLOL SUCCINATE (TOPROL-XL) 25 MG 24 HR TABLET    Take 1 tablet (25 mg total) by mouth daily.   MULTIPLE VITAMINS-MINERALS (PRESERVISION AREDS PO)    Take 1 capsule by mouth daily.   NEPAFENAC (NEVANAC) 0.1 % OPHTHALMIC SUSPENSION    Place 1 drop into the left eye 3 (three) times daily.    POLYETHYLENE GLYCOL (MIRALAX / GLYCOLAX) PACKET    Take 17 g by mouth. 17gram in beverage of choice every other morning for constipation   POTASSIUM CHLORIDE (MICRO-K) 10 MEQ CR CAPSULE    Take 20 mEq by  mouth daily.    SENNA (SENOKOT) 8.6 MG TABS    Take 1 tablet by mouth at bedtime.  Modified Medications   No medications on file  Discontinued Medications   DEXTROMETHORPHAN-GUAIFENESIN (MUCINEX DM) 30-600 MG PER 12 HR TABLET    1 every 12 hours to help cough and congestion     Physical Exam:  Filed Vitals:   09/09/14 1024  BP: 111/56  Pulse: 74  Temp: 97.2 F (36.2 C)  Resp: 16  Weight: 189 lb 3.2 oz (85.821 kg)  SpO2: 93%    Physical Exam  Constitutional: No distress.  frail  HENT:  Head: Normocephalic.  Mouth/Throat: No oropharyngeal  exudate.  Eyes: Conjunctivae are normal. Pupils are equal, round, and reactive to light. Right eye exhibits no discharge. Left eye exhibits no discharge.  Neck: No JVD present. No tracheal deviation present. No thyromegaly present.  Cardiovascular: Normal rate.  An irregularly irregular rhythm present.  No murmur heard. bilat lower ext edema +1  Pulmonary/Chest: Effort normal. No respiratory distress. She has no wheezes.  bilat exp wheeze  Abdominal: Soft. Bowel sounds are normal. She exhibits no distension. There is no tenderness.  Musculoskeletal:  Decreased ROM to both shoulders and hips.   Lymphadenopathy:    She has no cervical adenopathy.  Neurological: She is alert. No cranial nerve deficit.  Oriented to self and situation. Able to answer q's but poor historian. Able to f/c  Skin: Skin is warm and dry. She is not diaphoretic.  Psychiatric: Affect normal.    Labs reviewed/Significant Diagnostic Results:  Basic Metabolic Panel:  Recent Labs  03/17/14 06/30/14 07/21/14  NA 141 144 140  K 4.1 3.8 4.3  BUN 26* 35* 22*  CREATININE 1.0 0.8 0.7   Liver Function Tests: No results for input(s): AST, ALT, ALKPHOS, BILITOT, PROT, ALBUMIN in the last 8760 hours. No results for input(s): LIPASE, AMYLASE in the last 8760 hours. No results for input(s): AMMONIA in the last 8760 hours. CBC:  Recent Labs  10/22/13 05/20/14 07/28/14  WBC 2.7 2.2 2.5  HGB 10.8* 9.3* 9.1*  HCT 32* 27* 25*  PLT 73* 64* 64*   CBG: No results for input(s): GLUCAP in the last 8760 hours. TSH: No results for input(s): TSH in the last 8760 hours. A1C: Lab Results  Component Value Date   HGBA1C * 11/02/2009    6.5 (NOTE)                                                                       According to the ADA Clinical Practice Recommendations for 2011, when HbA1c is used as a screening test:   >=6.5%   Diagnostic of Diabetes Mellitus           (if abnormal result  is confirmed)  5.7-6.4%   Increased  risk of developing Diabetes Mellitus  References:Diagnosis and Classification of Diabetes Mellitus,Diabetes OFBP,1025,85(IDPOE 1):S62-S69 and Standards of Medical Care in         Diabetes - 2011,Diabetes UMPN,3614,43  (Suppl 1):S11-S61.   05/20/14: Iron 81, TIBC 212, %sat 38, B12 364, Folate 11.5, ferritin 292  CXR reviewed 07/30/2014 Cardiomegaly, without CHF. Left basilar atx but otherwise neg for pna  Assessment/Plan  1. Chronic atrial fibrillation Rate  controlled on BB. Not on anticoagulation due to anemia and hx of hematuria. No neuro events per staff  2. Essential hypertension Controlled, last BMP WNL. Current on BB only.  3. Congestive heart failure with LV diastolic dysfunction, NYHA class 1 Weights stable on 60mg  of Lasix daily. Would not add ACE, goals of care are comfort based, no hospitalizations  4. Dysphagia, pharyngoesophageal phase Doing well with mech soft diet and NTL. D/C Robitussin.  5. Other pancytopenia Stable, possibly myelodysplastic syndrome. No work up per POA due to age and debility.  Periodically monitor CBC, due in March  6. Alzheimer's disease Continues on Aricept and Namenda. Still able to f/c, answer q's, feed herself, and walk very short distances. Continue meds at this time.  7. Slow transit constipation Stable on Senna and Miralax  8. Hematuria No work up , see above. No new bouts.    Cindi Carbon, ANP [facility] [phone]

## 2014-10-12 ENCOUNTER — Non-Acute Institutional Stay (SKILLED_NURSING_FACILITY): Payer: Medicare Other | Admitting: Nurse Practitioner

## 2014-10-12 DIAGNOSIS — I10 Essential (primary) hypertension: Secondary | ICD-10-CM | POA: Diagnosis not present

## 2014-10-12 DIAGNOSIS — R1314 Dysphagia, pharyngoesophageal phase: Secondary | ICD-10-CM

## 2014-10-12 DIAGNOSIS — G309 Alzheimer's disease, unspecified: Secondary | ICD-10-CM

## 2014-10-12 DIAGNOSIS — K5901 Slow transit constipation: Secondary | ICD-10-CM

## 2014-10-12 DIAGNOSIS — I5030 Unspecified diastolic (congestive) heart failure: Secondary | ICD-10-CM

## 2014-10-12 DIAGNOSIS — F0280 Dementia in other diseases classified elsewhere without behavioral disturbance: Secondary | ICD-10-CM

## 2014-10-12 NOTE — Progress Notes (Signed)
Patient ID: Nicole Roach, female   DOB: [birth year], 81 y.o.   MRN: [id number]     Nursing Home Location:  [facility]   Code Status: DNR with comfort measures   Place of Service: SNF (31)  Chief Complaint  Patient presents with  . Medical Management of Chronic Issues    HPI:  81 y.o. female residing at [facility], skilled care section, seen today for routine follow up on chronic conditions.  She has a hx of AD, CHF diastolic, HTN, afib, pancytopenia and hematuria. Pt goals of care comfort. There has been no acute issues in the last month and is doing well per staff. Pt denies pain or discomfort and staff has no concerns.   Review of Systems:  Review of Systems  Constitutional: Negative for fever, chills, activity change and appetite change.  HENT: Negative for congestion, ear pain, nosebleeds, rhinorrhea, sore throat and trouble swallowing.   Respiratory: Negative for cough, shortness of breath and wheezing.   Cardiovascular: Positive for leg swelling. Negative for chest pain and palpitations.  Gastrointestinal: Negative for abdominal pain, constipation and abdominal distention.  Genitourinary: Negative for dysuria and hematuria.  Musculoskeletal: Positive for gait problem. Negative for back pain and arthralgias.  Skin: Negative for color change and rash.  Neurological: Negative for dizziness, seizures, speech difficulty and light-headedness.  Psychiatric/Behavioral: Positive for confusion. Negative for behavioral problems and agitation. The patient is not nervous/anxious.        Memory loss    Medications: Patient's Medications  New Prescriptions   No medications on file  Previous Medications   BRIMONIDINE-TIMOLOL (COMBIGAN) 0.2-0.5 % OPHTHALMIC SOLUTION    Place 1 drop into the left eye 2 (two) times daily.   CHLORHEXIDINE (PERIDEX) 0.12 % SOLUTION    Use as directed 15 mLs in the mouth or throat 2 (two) times daily.   CHOLECALCIFEROL (VITAMIN D) 2000 UNITS TABLET    Take 2,000 Units by mouth daily.   DONEPEZIL (ARICEPT) 10 MG TABLET    Take 5 mg by mouth at bedtime.    FUROSEMIDE (LASIX) 40 MG TABLET    Take 60 mg by mouth daily.    IPRATROPIUM-ALBUTEROL (DUONEB) 0.5-2.5 (3) MG/3ML SOLN    Take 3 mLs by nebulization every 6 (six) hours.   MEMANTINE HCL ER (NAMENDA XR) 28 MG CP24    Take 1 capsule by mouth daily.   METOPROLOL SUCCINATE (TOPROL-XL) 25 MG 24 HR TABLET    Take 1 tablet (25 mg total) by mouth daily.   MULTIPLE VITAMINS-MINERALS (PRESERVISION AREDS PO)    Take 1 capsule by mouth daily.   NEPAFENAC (NEVANAC) 0.1 % OPHTHALMIC SUSPENSION    Place 1 drop into the left eye 3 (three) times daily.    POLYETHYLENE GLYCOL (MIRALAX / GLYCOLAX) PACKET    Take 17 g by mouth. 17gram in beverage of choice every other morning for constipation   POTASSIUM CHLORIDE (MICRO-K) 10 MEQ CR CAPSULE    Take 20 mEq by mouth daily.    SENNA (SENOKOT) 8.6 MG TABS    Take 1 tablet by mouth at bedtime.  Modified Medications   No medications on file  Discontinued Medications   No medications on file     Physical Exam:  Filed Vitals:   10/12/14 1516  BP: 101/61  Pulse: 66  Temp: 97.3 F (36.3 C)  Resp: 20  Weight: 193 lb (87.544 kg)    Physical Exam  Constitutional: No distress.  frail  HENT:  Head: Normocephalic.  Mouth/Throat: No oropharyngeal exudate.  Eyes: Conjunctivae are normal. Pupils are equal, round, and reactive to light. Right eye exhibits no discharge. Left eye exhibits no discharge.  Neck: No JVD present. No tracheal deviation present. No thyromegaly present.  Cardiovascular: Normal rate.  An irregularly irregular rhythm present.  No murmur heard. bilat lower ext edema +1  Pulmonary/Chest: Effort normal. No respiratory distress. She has no wheezes.  bilat exp wheeze  Abdominal: Soft. Bowel sounds are normal. She exhibits no distension. There is no tenderness.  Musculoskeletal:  Decreased ROM  to both shoulders and hips.   Lymphadenopathy:    She has no cervical adenopathy.  Neurological: She is alert. No cranial nerve deficit.  Oriented to self and situation. Able to answer q's but poor historian. Able to f/c  Skin: Skin is warm and dry. She is not diaphoretic.  Psychiatric: Affect normal.    Labs reviewed/Significant Diagnostic Results:  Basic Metabolic Panel:  Recent Labs  03/17/14 06/30/14 07/21/14  NA 141 144 140  K 4.1 3.8 4.3  BUN 26* 35* 22*  CREATININE 1.0 0.8 0.7   Liver Function Tests: No results for input(s): AST, ALT, ALKPHOS, BILITOT, PROT, ALBUMIN in the last 8760 hours. No results for input(s): LIPASE, AMYLASE in the last 8760 hours. No results for input(s): AMMONIA in the last 8760 hours. CBC:  Recent Labs  10/22/13 05/20/14 07/28/14  WBC 2.7 2.2 2.5  HGB 10.8* 9.3* 9.1*  HCT 32* 27* 25*  PLT 73* 64* 64*   CBG: No results for input(s): GLUCAP in the last 8760 hours. TSH: No results for input(s): TSH in the last 8760 hours. A1C: Lab Results  Component Value Date   HGBA1C * 11/02/2009    6.5 (NOTE)                                                                       According to the ADA Clinical Practice Recommendations for 2011, when HbA1c is used as a screening test:   >=6.5%   Diagnostic of Diabetes Mellitus           (if abnormal result  is confirmed)  5.7-6.4%   Increased risk of developing Diabetes Mellitus  References:Diagnosis and Classification of Diabetes Mellitus,Diabetes KGUR,4270,62(BJSEG 1):S62-S69 and Standards of Medical Care in         Diabetes - 2011,Diabetes BTDV,7616,07  (Suppl 1):S11-S61.   05/20/14: Iron 81, TIBC 212, %sat 38, B12 364, Folate 11.5, ferritin 292  CXR reviewed 07/30/2014 Cardiomegaly, without CHF. Left basilar atx but otherwise neg for pna  Assessment/Plan  1. Essential hypertension Blood pressure at goal, cont current medications  2. Congestive heart failure with LV diastolic dysfunction, NYHA class  1 well maintained on current lasix 60 mg and betablocker.   3. Slow transit constipation -without complaints on current regimen  4. Alzheimer's disease Advanced dementia, no acute changes in cognitive or functional status, conts on aricept and Namenda   5. Dysphagia, pharyngoesophageal phase Without signs of aspiration, conts on precautions and neclar thick liquids

## 2014-11-09 ENCOUNTER — Non-Acute Institutional Stay: Payer: Medicare Other | Admitting: Adult Health

## 2014-11-09 ENCOUNTER — Encounter: Payer: Self-pay | Admitting: Adult Health

## 2014-11-09 DIAGNOSIS — I10 Essential (primary) hypertension: Secondary | ICD-10-CM | POA: Diagnosis not present

## 2014-11-09 DIAGNOSIS — I5030 Unspecified diastolic (congestive) heart failure: Secondary | ICD-10-CM

## 2014-11-09 DIAGNOSIS — K5901 Slow transit constipation: Secondary | ICD-10-CM | POA: Diagnosis not present

## 2014-11-09 DIAGNOSIS — G309 Alzheimer's disease, unspecified: Secondary | ICD-10-CM | POA: Diagnosis not present

## 2014-11-09 DIAGNOSIS — R319 Hematuria, unspecified: Secondary | ICD-10-CM | POA: Diagnosis not present

## 2014-11-09 DIAGNOSIS — R1314 Dysphagia, pharyngoesophageal phase: Secondary | ICD-10-CM | POA: Diagnosis not present

## 2014-11-09 DIAGNOSIS — D61818 Other pancytopenia: Secondary | ICD-10-CM | POA: Diagnosis not present

## 2014-11-09 DIAGNOSIS — F0280 Dementia in other diseases classified elsewhere without behavioral disturbance: Secondary | ICD-10-CM

## 2014-11-09 NOTE — Progress Notes (Signed)
Patient ID: Nicole Roach, female   DOB: [birth year], 81 y.o.   MRN: [id number]      Nursing Home Location:  [facility]   Code Status: DNR with comfort measures   Place of Service: SNF (31)  Chief Complaint  Patient presents with  . Medical Management of Chronic Issues    HPI:  81 y.o. female residing at [facility], skilled care section, seen today for routine follow up on chronic conditions.  She has a hx of AD, CHF diastolic, HTN, afib, pancytopenia and hematuria. She has had one episode of hematuria in the past few months per the staff.  Pt goals of care comfort. Her weight has remained stable at 193 lbs. Her VS have been stable over the past month.  Review of Systems:  Review of Systems  Constitutional: Negative for fever, chills, activity change and appetite change.  HENT: Negative for congestion, ear pain, nosebleeds, rhinorrhea, sore throat and trouble swallowing.   Respiratory: Negative for cough, shortness of breath and wheezing.   Cardiovascular: Positive for leg swelling. Negative for chest pain and palpitations.  Gastrointestinal: Negative for abdominal pain, constipation and abdominal distention.  Genitourinary: Negative for dysuria and hematuria.  Musculoskeletal: Positive for gait problem. Negative for back pain and arthralgias.  Skin: Negative for color change and rash.  Neurological: Negative for dizziness, seizures, speech difficulty and light-headedness.  Psychiatric/Behavioral: Positive for confusion. Negative for behavioral problems and agitation. The patient is not nervous/anxious.        Memory loss    Medications: Patient's Medications  New Prescriptions   No medications on file  Previous Medications   BRIMONIDINE-TIMOLOL (COMBIGAN) 0.2-0.5 % OPHTHALMIC SOLUTION    Place 1 drop into the left eye 2 (two) times daily.   CHLORHEXIDINE (PERIDEX) 0.12 % SOLUTION    Use as directed 15 mLs in the mouth or throat 2  (two) times daily.   CHOLECALCIFEROL (VITAMIN D) 2000 UNITS TABLET    Take 2,000 Units by mouth daily.   DONEPEZIL (ARICEPT) 10 MG TABLET    Take 5 mg by mouth at bedtime.    FUROSEMIDE (LASIX) 40 MG TABLET    Take 60 mg by mouth daily.    IPRATROPIUM-ALBUTEROL (DUONEB) 0.5-2.5 (3) MG/3ML SOLN    Take 3 mLs by nebulization every 6 (six) hours.   MEMANTINE HCL ER (NAMENDA XR) 28 MG CP24    Take 1 capsule by mouth daily.   METOPROLOL SUCCINATE (TOPROL-XL) 25 MG 24 HR TABLET    Take 1 tablet (25 mg total) by mouth daily.   MULTIPLE VITAMINS-MINERALS (PRESERVISION AREDS PO)    Take 1 capsule by mouth daily.   NEPAFENAC (NEVANAC) 0.1 % OPHTHALMIC SUSPENSION    Place 1 drop into the left eye 3 (three) times daily.    POLYETHYLENE GLYCOL (MIRALAX / GLYCOLAX) PACKET    Take 17 g by mouth. 17gram in beverage of choice every other morning for constipation   POTASSIUM CHLORIDE (MICRO-K) 10 MEQ CR CAPSULE    Take 20 mEq by mouth daily.    SENNA (SENOKOT) 8.6 MG TABS    Take 1 tablet by mouth at bedtime.  Modified Medications   No medications on file  Discontinued Medications   No medications on file     Physical Exam:  Filed Vitals:   11/09/14 1144  BP: 121/55  Pulse: 66  Temp: 97.8 F (36.6 C)  Resp: 14  Weight: 193 lb 6.4 oz (87.726 kg)  SpO2: 93%  Physical Exam  Constitutional: No distress.  frail  HENT:  Head: Normocephalic.  Mouth/Throat: No oropharyngeal exudate.  Eyes: Conjunctivae are normal. Pupils are equal, round, and reactive to light. Right eye exhibits no discharge. Left eye exhibits no discharge.  Neck: No JVD present. No tracheal deviation present. No thyromegaly present.  Cardiovascular: Normal rate.  An irregularly irregular rhythm present.  Murmur (systolic 2/6) heard. bilat lower ext edema +1  Pulmonary/Chest: Effort normal. No respiratory distress. She has no wheezes.  bilat exp wheeze  Abdominal: Soft. Bowel sounds are normal. She exhibits no distension. There  is no tenderness.  Musculoskeletal:  Decreased ROM to both shoulders and hips.   Lymphadenopathy:    She has no cervical adenopathy.  Neurological: She is alert. No cranial nerve deficit.  Oriented to self and situation. Able to answer q's but poor historian. Able to f/c  Skin: Skin is warm and dry. She is not diaphoretic.  Psychiatric: Affect normal.    Labs reviewed/Significant Diagnostic Results:  Basic Metabolic Panel:  Recent Labs  03/17/14 06/30/14 07/21/14  NA 141 144 140  K 4.1 3.8 4.3  BUN 26* 35* 22*  CREATININE 1.0 0.8 0.7   Liver Function Tests: No results for input(s): AST, ALT, ALKPHOS, BILITOT, PROT, ALBUMIN in the last 8760 hours. No results for input(s): LIPASE, AMYLASE in the last 8760 hours. No results for input(s): AMMONIA in the last 8760 hours. CBC:  Recent Labs  05/20/14 07/28/14  WBC 2.2 2.5  HGB 9.3* 9.1*  HCT 27* 25*  PLT 64* 64*   05/20/14: Iron 81, TIBC 212, %sat 38, B12 364, Folate 11.5, ferritin 292  CXR reviewed 07/30/2014 Cardiomegaly, without CHF. Left basilar atx but otherwise neg for pna  Assessment/Plan  1. Essential hypertension Blood pressure at goal, cont current medications  2. Congestive heart failure with LV diastolic dysfunction, NYHA class 1 well maintained on current lasix 60 mg and betablocker.   3. Slow transit constipation -without complaints on current regimen  4. Alzheimer's disease Advanced dementia, no acute changes in cognitive or functional status, conts on aricept and Namenda   5. Dysphagia, pharyngoesophageal phase Without signs of aspiration, conts on precautions and neclar thick liquids   6. Hematuria Stable, no interventions or work up due to age and debility  7. Pancytopenia Check CBC   Cindi Carbon, ANP [facility] [phone]

## 2014-11-11 LAB — CBC AND DIFFERENTIAL
HCT: 26 % — AB (ref 36–46)
HEMOGLOBIN: 8.2 g/dL — AB (ref 12.0–16.0)
PLATELETS: 42 10*3/uL — AB (ref 150–399)
WBC: 1.8 10*3/mL

## 2014-12-07 ENCOUNTER — Encounter: Payer: Self-pay | Admitting: Adult Health

## 2014-12-07 ENCOUNTER — Non-Acute Institutional Stay: Payer: Medicare Other | Admitting: Adult Health

## 2014-12-07 DIAGNOSIS — F0280 Dementia in other diseases classified elsewhere without behavioral disturbance: Secondary | ICD-10-CM

## 2014-12-07 DIAGNOSIS — I5030 Unspecified diastolic (congestive) heart failure: Secondary | ICD-10-CM

## 2014-12-07 DIAGNOSIS — D649 Anemia, unspecified: Secondary | ICD-10-CM | POA: Diagnosis not present

## 2014-12-07 DIAGNOSIS — R1314 Dysphagia, pharyngoesophageal phase: Secondary | ICD-10-CM

## 2014-12-07 DIAGNOSIS — G309 Alzheimer's disease, unspecified: Secondary | ICD-10-CM

## 2014-12-07 DIAGNOSIS — I482 Chronic atrial fibrillation, unspecified: Secondary | ICD-10-CM

## 2014-12-07 DIAGNOSIS — R319 Hematuria, unspecified: Secondary | ICD-10-CM

## 2014-12-07 NOTE — Progress Notes (Signed)
Patient ID: Nicole Roach, female   DOB: 17-May-1922, 81 y.o.   MRN: [id number]      Nursing Home Location:  [facility]   Code Status: DNR with comfort measures   Place of Service: SNF (31)  Chief Complaint  Patient presents with  . Medical Management of Chronic Issues    HPI:  81 y.o. female residing at [facility], skilled care section, seen today for routine follow up on chronic conditions.  She has a hx of AD, CHF diastolic, HTN, afib, pancytopenia and hematuria. She continues to have intermittent episodes of hematuria, with no pain.   Pt goals of care comfort. Her weight has remained stable at 193.4 lbs. Her VS have been stable over the past month. In regards to the pancytopenia, her WBC, Hgb, and Plt continue to trend downward over time. Her MCV has continued to rise at 110 as of 4/21.  Her previous B12 and folate levels were normal. She has no bruising and no recent infections.  Review of Systems:  Review of Systems  Constitutional: Negative for fever, chills, activity change and appetite change.  HENT: Negative for congestion, ear pain, nosebleeds, rhinorrhea, sore throat and trouble swallowing.   Respiratory: Negative for cough, shortness of breath and wheezing.   Cardiovascular: Positive for leg swelling. Negative for chest pain and palpitations.  Gastrointestinal: Negative for abdominal pain, constipation and abdominal distention.  Genitourinary: Positive for hematuria. Negative for dysuria.  Musculoskeletal: Positive for gait problem. Negative for back pain and arthralgias.  Skin: Negative for color change and rash.  Neurological: Negative for dizziness, seizures, speech difficulty and light-headedness.  Psychiatric/Behavioral: Positive for confusion. Negative for behavioral problems and agitation. The patient is not nervous/anxious.        Memory loss    Medications: Patient's Medications  New Prescriptions   No medications  on file  Previous Medications   BRIMONIDINE-TIMOLOL (COMBIGAN) 0.2-0.5 % OPHTHALMIC SOLUTION    Place 1 drop into the left eye 2 (two) times daily.   CHOLECALCIFEROL (VITAMIN D) 2000 UNITS TABLET    Take 2,000 Units by mouth daily.   DONEPEZIL (ARICEPT) 10 MG TABLET    Take 5 mg by mouth at bedtime.    FUROSEMIDE (LASIX) 40 MG TABLET    Take 60 mg by mouth daily.    IPRATROPIUM-ALBUTEROL (DUONEB) 0.5-2.5 (3) MG/3ML SOLN    Take 3 mLs by nebulization every 6 (six) hours.   MEMANTINE HCL ER (NAMENDA XR) 28 MG CP24    Take 1 capsule by mouth daily.   METOPROLOL SUCCINATE (TOPROL-XL) 25 MG 24 HR TABLET    Take 1 tablet (25 mg total) by mouth daily.   MULTIPLE VITAMINS-MINERALS (PRESERVISION AREDS PO)    Take 1 capsule by mouth daily.   NEPAFENAC (NEVANAC) 0.1 % OPHTHALMIC SUSPENSION    Place 1 drop into the left eye 3 (three) times daily.    POLYETHYLENE GLYCOL (MIRALAX / GLYCOLAX) PACKET    Take 17 g by mouth. 17gram in beverage of choice every other morning for constipation   POTASSIUM CHLORIDE (MICRO-K) 10 MEQ CR CAPSULE    Take 20 mEq by mouth daily.    SENNA (SENOKOT) 8.6 MG TABS    Take 1 tablet by mouth at bedtime.  Modified Medications   No medications on file  Discontinued Medications   CHLORHEXIDINE (PERIDEX) 0.12 % SOLUTION    Use as directed 15 mLs in the mouth or throat 2 (two) times daily.  Physical Exam:  Filed Vitals:   12/07/14 1120  BP: 119/59  Pulse: 66  Temp: 97.9 F (36.6 C)  Resp: 16  Weight: 193 lb 6.4 oz (87.726 kg)  SpO2: 92%    Physical Exam  Constitutional: No distress.  frail  HENT:  Head: Normocephalic.  Mouth/Throat: No oropharyngeal exudate.  Eyes: Conjunctivae are normal. Pupils are equal, round, and reactive to light. Right eye exhibits no discharge. Left eye exhibits no discharge.  Neck: No JVD present. No tracheal deviation present. No thyromegaly present.  Cardiovascular: Normal rate.  An irregularly irregular rhythm present.  Murmur  (systolic 2/6) heard. bilat lower ext edema +1  Pulmonary/Chest: Effort normal. No respiratory distress. She has no wheezes.  bilat exp wheeze  Abdominal: Soft. Bowel sounds are normal. She exhibits no distension. There is no tenderness.  Musculoskeletal:  Decreased ROM to both shoulders and hips.   Lymphadenopathy:    She has no cervical adenopathy.  Neurological: She is alert. No cranial nerve deficit.  Oriented to self and situation. Able to answer q's but poor historian. Able to f/c  Skin: Skin is warm and dry. She is not diaphoretic.  Psychiatric: Affect normal.    Labs reviewed/Significant Diagnostic Results:  Basic Metabolic Panel:  Recent Labs  03/17/14 06/30/14 07/21/14  NA 141 144 140  K 4.1 3.8 4.3  BUN 26* 35* 22*  CREATININE 1.0 0.8 0.7   Liver Function Tests: No results for input(s): AST, ALT, ALKPHOS, BILITOT, PROT, ALBUMIN in the last 8760 hours. No results for input(s): LIPASE, AMYLASE in the last 8760 hours. No results for input(s): AMMONIA in the last 8760 hours. CBC:  Recent Labs  05/20/14 07/28/14 11/11/14  WBC 2.2 2.5 1.8  HGB 9.3* 9.1* 8.2*  HCT 27* 25* 26*  PLT 64* 64* 42*   05/20/14: Iron 81, TIBC 212, %sat 38, B12 364, Folate 11.5, ferritin 292  CXR reviewed 07/30/2014 Cardiomegaly, without CHF. Left basilar atx but otherwise neg for pna  Assessment/Plan  1. Alzheimer's disease She remains dependent for most ADL's but rather verbal, able to answer q's and follow commands. Due to her preserved communication skills, I think she would benefit from continuing the aricept and namenda at this time  2. Dysphagia, pharyngoesophageal phase Stable on mech soft diet with NTL  3. Chronic atrial fibrillation Rate controlled, no anticoagulation due to anemia  4. Congestive heart failure with LV diastolic dysfunction, NYHA class 1 Stable on 60 mg of lasix. Weight stable.   5. Hematuria Intermittent, no pain. No work up due to age and debility.    6. Anemia, unspecified anemia type Worsening over time, ?myelodysplasia? -recheck CBC, B12, folate, peripheral smear and copper   Cindi Carbon, ANP [facility] [phone]

## 2014-12-09 LAB — CBC AND DIFFERENTIAL
HCT: 27 % — AB (ref 36–46)
Hemoglobin: 9.1 g/dL — AB (ref 12.0–16.0)
PLATELETS: 56 10*3/uL — AB (ref 150–399)
WBC: 2 10*3/mL

## 2015-01-10 ENCOUNTER — Encounter: Payer: Self-pay | Admitting: Adult Health

## 2015-01-10 ENCOUNTER — Non-Acute Institutional Stay: Payer: Medicare Other | Admitting: Adult Health

## 2015-01-10 DIAGNOSIS — I10 Essential (primary) hypertension: Secondary | ICD-10-CM | POA: Diagnosis not present

## 2015-01-10 DIAGNOSIS — R1314 Dysphagia, pharyngoesophageal phase: Secondary | ICD-10-CM

## 2015-01-10 DIAGNOSIS — F0280 Dementia in other diseases classified elsewhere without behavioral disturbance: Secondary | ICD-10-CM

## 2015-01-10 DIAGNOSIS — D61818 Other pancytopenia: Secondary | ICD-10-CM | POA: Diagnosis not present

## 2015-01-10 DIAGNOSIS — I482 Chronic atrial fibrillation, unspecified: Secondary | ICD-10-CM

## 2015-01-10 DIAGNOSIS — G309 Alzheimer's disease, unspecified: Secondary | ICD-10-CM | POA: Diagnosis not present

## 2015-01-10 DIAGNOSIS — I5030 Unspecified diastolic (congestive) heart failure: Secondary | ICD-10-CM

## 2015-01-10 NOTE — Progress Notes (Signed)
Patient ID: Nicole Roach, female   DOB: Oct 03, 1921, 81 y.o.   MRN: [id number]      Nursing Home Location:  [facility]   Code Status: DNR with comfort measures   Place of Service: SNF (31)  Chief Complaint  Patient presents with  . Medical Management of Chronic Issues    HPI:  81 y.o. female residing at [facility], skilled care section, seen today for routine follow up on chronic conditions.  She has a hx of AD, CHF diastolic, HTN, afib, pancytopenia and hematuria. She has not any episodes of hematuria per the nsg notes. She denies abd pain today. We are not working this issue up given her age and debility She also has a chronic pancytopenia that fluctuates. Her last plt ct was 56, WBC 2.0, HGb 9.1 (12/09/14). She may have some sort of myelodysplastic disorder.  She has not had any aggressive work up for this issue either.  Her weight has decreased by 4 lbs in the past month. She is currently on mech soft diet with NTL. She is not ambulatory and spends most of her time in the [facility] or recliner.  Review of Systems:  Review of Systems  Constitutional: Negative for fever, chills, activity change and appetite change.  HENT: Negative for congestion, ear pain, nosebleeds, rhinorrhea, sore throat and trouble swallowing.   Respiratory: Negative for cough, shortness of breath and wheezing.   Cardiovascular: Positive for leg swelling. Negative for chest pain and palpitations.  Gastrointestinal: Negative for abdominal pain, constipation and abdominal distention.  Genitourinary: Positive for hematuria. Negative for dysuria.  Musculoskeletal: Positive for gait problem. Negative for back pain and arthralgias.  Skin: Negative for color change and rash.  Neurological: Negative for dizziness, seizures, speech difficulty and light-headedness.  Psychiatric/Behavioral: Positive for confusion. Negative for behavioral problems and agitation. The patient is not  nervous/anxious.        Memory loss    Medications: Patient's Medications  New Prescriptions   No medications on file  Previous Medications   BRIMONIDINE-TIMOLOL (COMBIGAN) 0.2-0.5 % OPHTHALMIC SOLUTION    Place 1 drop into the left eye 2 (two) times daily.   CHOLECALCIFEROL (VITAMIN D) 2000 UNITS TABLET    Take 2,000 Units by mouth daily.   DONEPEZIL (ARICEPT) 10 MG TABLET    Take 5 mg by mouth at bedtime.    FUROSEMIDE (LASIX) 40 MG TABLET    Take 60 mg by mouth daily.    IPRATROPIUM-ALBUTEROL (DUONEB) 0.5-2.5 (3) MG/3ML SOLN    Take 3 mLs by nebulization every 6 (six) hours.   MEMANTINE HCL ER (NAMENDA XR) 28 MG CP24    Take 1 capsule by mouth daily.   METOPROLOL SUCCINATE (TOPROL-XL) 25 MG 24 HR TABLET    Take 1 tablet (25 mg total) by mouth daily.   MULTIPLE VITAMINS-MINERALS (PRESERVISION AREDS PO)    Take 1 capsule by mouth daily.   NEPAFENAC (NEVANAC) 0.1 % OPHTHALMIC SUSPENSION    Place 1 drop into the left eye 3 (three) times daily.    POLYETHYLENE GLYCOL (MIRALAX / GLYCOLAX) PACKET    Take 17 g by mouth. 17gram in beverage of choice every other morning for constipation   POTASSIUM CHLORIDE (MICRO-K) 10 MEQ CR CAPSULE    Take 20 mEq by mouth daily.    SENNA (SENOKOT) 8.6 MG TABS    Take 1 tablet by mouth at bedtime.  Modified Medications   No medications on file  Discontinued Medications   No  medications on file     Physical Exam:  Filed Vitals:   01/10/15 1338  BP: 115/55  Pulse: 67  Temp: 97.2 F (36.2 C)  Resp: 18  Weight: 189 lb 6.4 oz (85.911 kg)  SpO2: 96%   Wt Readings from Last 3 Encounters:  01/10/15 189 lb 6.4 oz (85.911 kg)  12/07/14 193 lb 6.4 oz (87.726 kg)  11/09/14 193 lb 6.4 oz (87.726 kg)     Physical Exam  Constitutional: No distress.  frail  HENT:  Head: Normocephalic.  Mouth/Throat: No oropharyngeal exudate.  Eyes: Conjunctivae are normal. Pupils are equal, round, and reactive to light. Right eye exhibits no discharge. Left eye  exhibits no discharge.  Neck: No JVD present. No tracheal deviation present. No thyromegaly present.  Cardiovascular: Normal rate.  An irregularly irregular rhythm present.  Murmur (systolic 3/6) heard. bilat lower ext edema +1  Pulmonary/Chest: Effort normal. No respiratory distress. She has no wheezes.  Abdominal: Soft. Bowel sounds are normal. She exhibits no distension. There is no tenderness.  Musculoskeletal:  Decreased ROM to both shoulders and hips.   Lymphadenopathy:    She has no cervical adenopathy.  Neurological: She is alert. No cranial nerve deficit.  Oriented to self and situation. Able to answer q's but poor historian. Able to f/c  Skin: Skin is warm and dry. She is not diaphoretic.  Psychiatric: Affect normal.    Labs reviewed/Significant Diagnostic Results:  Basic Metabolic Panel:  Recent Labs  03/17/14 06/30/14 07/21/14  NA 141 144 140  K 4.1 3.8 4.3  BUN 26* 35* 22*  CREATININE 1.0 0.8 0.7   Liver Function Tests: No results for input(s): AST, ALT, ALKPHOS, BILITOT, PROT, ALBUMIN in the last 8760 hours. No results for input(s): LIPASE, AMYLASE in the last 8760 hours. No results for input(s): AMMONIA in the last 8760 hours. CBC:  Recent Labs  07/28/14 11/11/14 12/09/14  WBC 2.5 1.8 2.0  HGB 9.1* 8.2* 9.1*  HCT 25* 26* 27*  PLT 64* 42* 56*   05/20/14: Iron 81, TIBC 212, %sat 38, B12 364, Folate 11.5, ferritin 292 12/09/14: B12 402, Folate 8.8, Copper 94  Assessment/Plan  1. Alzheimer's disease Stable, continue aricept and namenda   2. Dysphagia, pharyngoesophageal phase Stable on mech soft diet with NTL. Continue to monitor weight.   3. Chronic atrial fibrillation Rate controlled, no anticoagulation due to anemia  4. Congestive heart failure with LV diastolic dysfunction, NYHA class 1 Stable on 60 mg of lasix. Check BMP  5. Pancytopenia Slightly improved since our last visit. Avoid ASA and anticoagulation. No workup. Continue intermittent CBC  monitoring.   6. Anemia B12 normal, check MMA  7. HTN BP controlled, continue current meds. Goal 150/90   Cindi Carbon, ANP [facility] [phone]

## 2015-01-11 LAB — HEPATIC FUNCTION PANEL
ALT: 8 U/L (ref 7–35)
AST: 11 U/L — AB (ref 13–35)
Alkaline Phosphatase: 70 U/L (ref 25–125)
Bilirubin, Total: 0.5 mg/dL

## 2015-01-11 LAB — BASIC METABOLIC PANEL
BUN: 29 mg/dL — AB (ref 4–21)
Creatinine: 0.9 mg/dL (ref 0.5–1.1)
Glucose: 96 mg/dL
POTASSIUM: 3.9 mmol/L (ref 3.4–5.3)
Sodium: 143 mmol/L (ref 137–147)

## 2015-02-10 ENCOUNTER — Encounter: Payer: Self-pay | Admitting: Adult Health

## 2015-02-10 ENCOUNTER — Non-Acute Institutional Stay: Payer: Medicare Other | Admitting: Adult Health

## 2015-02-10 DIAGNOSIS — I10 Essential (primary) hypertension: Secondary | ICD-10-CM

## 2015-02-10 DIAGNOSIS — J302 Other seasonal allergic rhinitis: Secondary | ICD-10-CM

## 2015-02-10 DIAGNOSIS — J309 Allergic rhinitis, unspecified: Secondary | ICD-10-CM | POA: Insufficient documentation

## 2015-02-10 DIAGNOSIS — K5901 Slow transit constipation: Secondary | ICD-10-CM

## 2015-02-10 DIAGNOSIS — D61818 Other pancytopenia: Secondary | ICD-10-CM

## 2015-02-10 DIAGNOSIS — F0280 Dementia in other diseases classified elsewhere without behavioral disturbance: Secondary | ICD-10-CM

## 2015-02-10 DIAGNOSIS — I482 Chronic atrial fibrillation, unspecified: Secondary | ICD-10-CM

## 2015-02-10 DIAGNOSIS — G309 Alzheimer's disease, unspecified: Secondary | ICD-10-CM | POA: Diagnosis not present

## 2015-02-10 DIAGNOSIS — I5030 Unspecified diastolic (congestive) heart failure: Secondary | ICD-10-CM

## 2015-02-10 DIAGNOSIS — R1314 Dysphagia, pharyngoesophageal phase: Secondary | ICD-10-CM

## 2015-02-10 NOTE — Progress Notes (Signed)
Patient ID: Nicole Roach, female   DOB: 1922-01-16, 81 y.o.   MRN: [id number]      Nursing Home Location:  [facility]   Code Status: DNR with comfort measures   Place of Service: SNF (31)  Chief Complaint  Patient presents with  . Medical Management of Chronic Issues    HPI:  81 y.o. female residing at [facility], skilled care section, seen today for routine follow up on chronic conditions.  She has a hx of AD, CHF diastolic, HTN, afib, pancytopenia and hematuria.  She also has a chronic pancytopenia but is asymptomatic at this time with no bruising, fever, etc.  She may have myelodysplastic disorder.  She has not had any aggressive work up for this issue due to her age and debility.  She reports a runny irritate nose today but has not had a cough or fever. She states it has been bothering her for quite some time but due to her dementia she can not elaborate.   Functional status: non ambulatory, uses WC  Review of Systems:  Review of Systems  Constitutional: Negative for fever, chills, activity change and appetite change.  HENT: Negative for congestion, ear pain, nosebleeds, rhinorrhea, sore throat and trouble swallowing.   Respiratory: Negative for cough, shortness of breath and wheezing.   Cardiovascular: Positive for leg swelling. Negative for chest pain and palpitations.  Gastrointestinal: Negative for abdominal pain, constipation and abdominal distention.  Genitourinary: Positive for hematuria. Negative for dysuria.  Musculoskeletal: Positive for gait problem. Negative for back pain and arthralgias.  Skin: Negative for color change and rash.  Neurological: Negative for dizziness, seizures, speech difficulty and light-headedness.  Psychiatric/Behavioral: Positive for confusion. Negative for behavioral problems and agitation. The patient is not nervous/anxious.        Memory loss    Medications: Patient's Medications  New  Prescriptions   No medications on file  Previous Medications   BRIMONIDINE-TIMOLOL (COMBIGAN) 0.2-0.5 % OPHTHALMIC SOLUTION    Place 1 drop into the left eye 2 (two) times daily.   CHOLECALCIFEROL (VITAMIN D) 2000 UNITS TABLET    Take 2,000 Units by mouth daily.   DONEPEZIL (ARICEPT) 10 MG TABLET    Take 5 mg by mouth at bedtime.    FUROSEMIDE (LASIX) 40 MG TABLET    Take 60 mg by mouth daily.    MEMANTINE HCL ER (NAMENDA XR) 28 MG CP24    Take 1 capsule by mouth daily.   METOPROLOL SUCCINATE (TOPROL-XL) 25 MG 24 HR TABLET    Take 1 tablet (25 mg total) by mouth daily.   MULTIPLE VITAMINS-MINERALS (PRESERVISION AREDS PO)    Take 1 capsule by mouth daily.   NEPAFENAC (NEVANAC) 0.1 % OPHTHALMIC SUSPENSION    Place 1 drop into the left eye 3 (three) times daily.    POLYETHYLENE GLYCOL (MIRALAX / GLYCOLAX) PACKET    Take 17 g by mouth. 17gram in beverage of choice every other morning for constipation   POTASSIUM CHLORIDE (MICRO-K) 10 MEQ CR CAPSULE    Take 20 mEq by mouth daily.    SENNA (SENOKOT) 8.6 MG TABS    Take 1 tablet by mouth at bedtime.  Modified Medications   No medications on file  Discontinued Medications   IPRATROPIUM-ALBUTEROL (DUONEB) 0.5-2.5 (3) MG/3ML SOLN    Take 3 mLs by nebulization every 6 (six) hours.     Physical Exam:  Filed Vitals:   02/10/15 1600  BP: 122/60  Pulse: 66  Temp:  96.5 F (35.8 C)  Resp: 16  Weight: 192 lb (87.091 kg)  SpO2: 90%   Wt Readings from Last 3 Encounters:  02/10/15 192 lb (87.091 kg)  01/10/15 189 lb 6.4 oz (85.911 kg)  12/07/14 193 lb 6.4 oz (87.726 kg)     Physical Exam  Constitutional: No distress.  frail  HENT:  Head: Normocephalic.  Nose: Mucosal edema and rhinorrhea present. Right sinus exhibits no maxillary sinus tenderness and no frontal sinus tenderness. Left sinus exhibits no maxillary sinus tenderness and no frontal sinus tenderness.  Mouth/Throat: No oropharyngeal exudate or posterior oropharyngeal erythema.    Eyes: Conjunctivae are normal. Pupils are equal, round, and reactive to light. Right eye exhibits no discharge. Left eye exhibits no discharge.  Neck: No JVD present. No tracheal deviation present. No thyromegaly present.  Cardiovascular: Normal rate.  An irregularly irregular rhythm present.  Murmur (systolic 3/6) heard. bilat lower ext edema +1  Pulmonary/Chest: Effort normal. No respiratory distress. She has no wheezes.  Abdominal: Soft. Bowel sounds are normal. She exhibits no distension. There is no tenderness.  Musculoskeletal:  Decreased ROM to both shoulders and hips.   Lymphadenopathy:    She has no cervical adenopathy.  Neurological: She is alert. No cranial nerve deficit.  Oriented to self and situation. Able to answer q's but poor historian. Able to f/c  Skin: Skin is warm and dry. She is not diaphoretic.  Psychiatric: Affect normal.    Labs reviewed/Significant Diagnostic Results:  Basic Metabolic Panel:  Recent Labs  06/30/14 07/21/14 01/11/15  NA 144 140 143  K 3.8 4.3 3.9  BUN 35* 22* 29*  CREATININE 0.8 0.7 0.9   Liver Function Tests:  Recent Labs  01/11/15  AST 11*  ALT 8  ALKPHOS 70   No results for input(s): LIPASE, AMYLASE in the last 8760 hours. No results for input(s): AMMONIA in the last 8760 hours. CBC:  Recent Labs  07/28/14 11/11/14 12/09/14  WBC 2.5 1.8 2.0  HGB 9.1* 8.2* 9.1*  HCT 25* 26* 27*  PLT 64* 42* 56*   05/20/14: Iron 81, TIBC 212, %sat 38, B12 364, Folate 11.5, ferritin 292 12/09/14: B12 402, Folate 8.8, Copper 94 12/2014 MMA 240  Assessment/Plan  1. Alzheimer's disease Stable, continue aricept and namenda   2. Dysphagia, pharyngoesophageal phase Stable on mech soft diet with NTL. Continue to monitor weight.   3. Chronic atrial fibrillation Rate controlled, no anticoagulation due to anemia  4. Congestive heart failure with LV diastolic dysfunction, NYHA class 1 Stable on 60 mg of lasix and potassium BMP WNL  5.  Pancytopenia Stable. Avoid ASA and anticoagulation. No workup. Continue intermittent CBC monitoring.   6. HTN BP controlled, continue current meds. Goal 150/90  7. Rhinitis Flonase 2 sprays each nostril qd   Cindi Carbon, [place] [phone]

## 2015-03-22 ENCOUNTER — Encounter: Payer: Self-pay | Admitting: Internal Medicine

## 2015-03-22 NOTE — Progress Notes (Signed)
Patient ID: Nicole Roach, female   DOB: 1922-05-26, 81 y.o.   MRN: [id number]

## 2015-03-24 ENCOUNTER — Non-Acute Institutional Stay: Payer: Medicare Other | Admitting: Adult Health

## 2015-03-24 ENCOUNTER — Encounter: Payer: Self-pay | Admitting: Adult Health

## 2015-03-24 DIAGNOSIS — B353 Tinea pedis: Secondary | ICD-10-CM

## 2015-03-24 NOTE — Progress Notes (Signed)
Patient ID: Nicole Roach, female   DOB: Jul 01, 1922, 81 y.o.   MRN: [id number]    Nursing Home Location:  [place]   Code Status: DNR  Patient Care Team: Gayland Curry, DO as PCP - General (Geriatric Medicine) Well Percy, NP as Nurse Practitioner (Nurse Practitioner)  N Place of Service: SNF (31)  Chief Complaint  Patient presents with  . Acute Visit    redness, odor between toes    HPI:  81 y.o. female residing at [facility], skilled care section. I was asked to see her today due to erythema and odor in between her toes. This was first noted today but has probably been present for quite some time. She wears closed toed compression hose and shoes during the day.      Review of Systems:  Review of Systems  Constitutional: Negative for fever, chills, diaphoresis, activity change and appetite change.  Skin: Positive for rash. Negative for color change, pallor and wound.    Medications: Patient's Medications  New Prescriptions   No medications on file  Previous Medications   BRIMONIDINE-TIMOLOL (COMBIGAN) 0.2-0.5 % OPHTHALMIC SOLUTION    Place 1 drop into the left eye 2 (two) times daily.   CHOLECALCIFEROL (VITAMIN D) 2000 UNITS TABLET    Take 2,000 Units by mouth daily.   DONEPEZIL (ARICEPT) 10 MG TABLET    Take 5 mg by mouth at bedtime.    FLUTICASONE (FLONASE) 50 MCG/ACT NASAL SPRAY    Place 2 sprays into both nostrils daily.   FUROSEMIDE (LASIX) 40 MG TABLET    Take 60 mg by mouth daily.    MEMANTINE HCL ER (NAMENDA XR) 28 MG CP24    Take 1 capsule by mouth daily.   METOPROLOL SUCCINATE (TOPROL-XL) 25 MG 24 HR TABLET    Take 1 tablet (25 mg total) by mouth daily.   MULTIPLE VITAMINS-MINERALS (PRESERVISION AREDS PO)    Take 1 capsule by mouth daily.   NEPAFENAC (NEVANAC) 0.1 % OPHTHALMIC SUSPENSION    Place 1 drop into the left eye 3 (three) times daily.    POLYETHYLENE GLYCOL (MIRALAX /  GLYCOLAX) PACKET    Take 17 g by mouth. 17gram in beverage of choice every other morning for constipation   POTASSIUM CHLORIDE (MICRO-K) 10 MEQ CR CAPSULE    Take 20 mEq by mouth daily.    SENNA (SENOKOT) 8.6 MG TABS    Take 1 tablet by mouth at bedtime.  Modified Medications   No medications on file  Discontinued Medications   No medications on file     Physical Exam:  Filed Vitals:   03/24/15 1101  BP: 119/56  Pulse: 62  Temp: 97.6 F (36.4 C)  Resp: 16  SpO2: 91%    Physical Exam  Constitutional: No distress.  Neurological: She is alert.  Oriented x2  Skin: Skin is warm and dry. She is not diaphoretic. There is erythema.  Erythema in between toes with scaly skin and odor noted. Toe nails are long and pressing into her skin  Psychiatric: Affect normal.    Labs reviewed/Significant Diagnostic Results:  Basic Metabolic Panel:  Recent Labs  06/30/14 07/21/14 01/11/15  NA 144 140 143  K 3.8 4.3 3.9  BUN 35* 22* 29*  CREATININE 0.8 0.7 0.9   Liver Function Tests:  Recent Labs  01/11/15  AST 11*  ALT 8  ALKPHOS 70   No results for input(s): LIPASE, AMYLASE in the last  8760 hours. No results for input(s): AMMONIA in the last 8760 hours. CBC:  Recent Labs  07/28/14 11/11/14 12/09/14  WBC 2.5 1.8 2.0  HGB 9.1* 8.2* 9.1*  HCT 25* 26* 27*  PLT 64* 42* 56*   CBG: No results for input(s): GLUCAP in the last 8760 hours. TSH: No results for input(s): TSH in the last 8760 hours. A1C: Lab Results  Component Value Date   HGBA1C * 11/02/2009    6.5 (NOTE)                                                                       According to the ADA Clinical Practice Recommendations for 2011, when HbA1c is used as a screening test:   >=6.5%   Diagnostic of Diabetes Mellitus           (if abnormal result  is confirmed)  5.7-6.4%   Increased risk of developing Diabetes Mellitus  References:Diagnosis and Classification of Diabetes Mellitus,Diabetes OHYW,7371,06(YIRSW  1):S62-S69 and Standards of Medical Care in         Diabetes - 2011,Diabetes NIOE,7035,00  (Suppl 1):S11-S61.   Lipid Panel: No results for input(s): CHOL, HDL, LDLCALC, TRIG, CHOLHDL, LDLDIRECT in the last 8760 hours.     Assessment/Plan  1. Tinea pedis of both feet -clotrimazole 1% between toes BID for 1 month -try open toe compression hose and removing shoes periodically through the day -wash feet daily with soap and water -podiatry to trim toe nails

## 2015-04-11 ENCOUNTER — Non-Acute Institutional Stay: Payer: Medicare Other | Admitting: Adult Health

## 2015-04-11 DIAGNOSIS — R404 Transient alteration of awareness: Secondary | ICD-10-CM | POA: Diagnosis not present

## 2015-04-11 DIAGNOSIS — R319 Hematuria, unspecified: Secondary | ICD-10-CM

## 2015-04-11 DIAGNOSIS — D61818 Other pancytopenia: Secondary | ICD-10-CM | POA: Diagnosis not present

## 2015-04-11 DIAGNOSIS — R4189 Other symptoms and signs involving cognitive functions and awareness: Secondary | ICD-10-CM

## 2015-04-11 NOTE — Progress Notes (Signed)
Patient ID: Nicole Roach, female   DOB: May 10, 1922, 81 y.o.   MRN: [id number]      Nursing Home Location:  [place]   Code Status: DNR, comfort care, most form in place  Patient Care Team: Gayland Curry, DO as PCP - General (Geriatric Medicine) Well Cantwell, NP as Nurse Practitioner (Nurse Practitioner)   Place of Service: SNF [phone])  Chief Complaint  Patient presents with  . Acute Visit    decreased loc    HPI:  81 y.o. female residing at [facility], skilled section. I was asked to see her today due to pale color and decreased LOC. She has a hx of advance dementia, as well as hematuria and pancytopenia (not worked up due to age and debility), CHF, afib (not on anticoagulation due to pancytopenia) and dysphagia.  She was doing well over the weekend but this morning she became minimally responsive and was not able to eat lunch. Her VS remained stable but she felt cool to the touch and pale to the nurse.  She was able to speak for the first part of our visit and she was not in any pain and had not had any fevers or other associated findings.  Review of Systems:  Review of Systems  Constitutional: Positive for activity change and appetite change. Negative for fever, chills, diaphoresis and fatigue.  HENT: Negative for congestion.   Respiratory: Negative for cough, choking, chest tightness, shortness of breath, wheezing and stridor.   Cardiovascular: Positive for leg swelling. Negative for chest pain and palpitations.  Gastrointestinal: Negative for nausea, abdominal pain, diarrhea, constipation and abdominal distention.  Genitourinary: Positive for hematuria (not noted recently per the nurse). Negative for dysuria, frequency and flank pain.  Musculoskeletal: Positive for arthralgias and gait problem.  Neurological: Positive for weakness. Negative for dizziness, tremors, seizures, syncope, facial asymmetry,  speech difficulty and light-headedness.  Psychiatric/Behavioral: Positive for confusion. Negative for behavioral problems and agitation.    Medications: Patient's Medications  New Prescriptions   No medications on file  Previous Medications   BRIMONIDINE-TIMOLOL (COMBIGAN) 0.2-0.5 % OPHTHALMIC SOLUTION    Place 1 drop into the left eye 2 (two) times daily.   CHOLECALCIFEROL (VITAMIN D) 2000 UNITS TABLET    Take 2,000 Units by mouth daily.   DONEPEZIL (ARICEPT) 10 MG TABLET    Take 5 mg by mouth at bedtime.    FLUTICASONE (FLONASE) 50 MCG/ACT NASAL SPRAY    Place 2 sprays into both nostrils daily.   FUROSEMIDE (LASIX) 40 MG TABLET    Take 60 mg by mouth daily.    MEMANTINE HCL ER (NAMENDA XR) 28 MG CP24    Take 1 capsule by mouth daily.   METOPROLOL SUCCINATE (TOPROL-XL) 25 MG 24 HR TABLET    Take 1 tablet (25 mg total) by mouth daily.   MULTIPLE VITAMINS-MINERALS (PRESERVISION AREDS PO)    Take 1 capsule by mouth daily.   NEPAFENAC (NEVANAC) 0.1 % OPHTHALMIC SUSPENSION    Place 1 drop into the left eye 3 (three) times daily.    POLYETHYLENE GLYCOL (MIRALAX / GLYCOLAX) PACKET    Take 17 g by mouth. 17gram in beverage of choice every other morning for constipation   POTASSIUM CHLORIDE (MICRO-K) 10 MEQ CR CAPSULE    Take 20 mEq by mouth daily.    SENNA (SENOKOT) 8.6 MG TABS    Take 1 tablet by mouth at bedtime.  Modified Medications   No medications on  file  Discontinued Medications   No medications on file     Physical Exam:  Filed Vitals:   04/11/15 1623  BP: 117/60  Pulse: 58  Temp: 97.6 F (36.4 C)  Resp: 16  SpO2: 93%    Physical Exam  Constitutional: No distress.  HENT:  Head: Normocephalic and atraumatic.  Nose: Nose normal.  Mouth/Throat: Oropharynx is clear and moist. No oropharyngeal exudate.  Eyes: Conjunctivae and EOM are normal. Pupils are equal, round, and reactive to light. Right eye exhibits no discharge. Left eye exhibits no discharge.  Pupil size 2/5    Neck: No JVD present. No tracheal deviation present. No thyromegaly present.  Cardiovascular:  Irregular with normal rate, edema BLE +1-2  Pulmonary/Chest: Effort normal. No respiratory distress.  Decreased throughout  Abdominal: Soft. Bowel sounds are normal. She exhibits no distension. There is no tenderness. There is no rebound and no guarding.  Lymphadenopathy:    She has no cervical adenopathy.  Neurological:  Not able to f/c for neuro assessment. Initially answered yes and no during our exam but then became unarousable  Skin: Skin is warm and dry. She is not diaphoretic. There is pallor.  Psychiatric:  lethargic    Wt Readings from Last 3 Encounters:  03/22/15 189 lb (85.73 kg)  02/10/15 192 lb (87.091 kg)  01/10/15 189 lb 6.4 oz (85.911 kg)     Labs reviewed/Significant Diagnostic Results:  Basic Metabolic Panel:  Recent Labs  06/30/14 07/21/14 01/11/15  NA 144 140 143  K 3.8 4.3 3.9  BUN 35* 22* 29*  CREATININE 0.8 0.7 0.9   Liver Function Tests:  Recent Labs  01/11/15  AST 11*  ALT 8  ALKPHOS 70   No results for input(s): LIPASE, AMYLASE in the last 8760 hours. No results for input(s): AMMONIA in the last 8760 hours. CBC:  Recent Labs  07/28/14 11/11/14 12/09/14  WBC 2.5 1.8 2.0  HGB 9.1* 8.2* 9.1*  HCT 25* 26* 27*  PLT 64* 42* 56*   CBG: No results for input(s): GLUCAP in the last 8760 hours. TSH: No results for input(s): TSH in the last 8760 hours. A1C: Lab Results  Component Value Date   HGBA1C * 11/02/2009    6.5 (NOTE)                                                                       According to the ADA Clinical Practice Recommendations for 2011, when HbA1c is used as a screening test:   >=6.5%   Diagnostic of Diabetes Mellitus           (if abnormal result  is confirmed)  5.7-6.4%   Increased risk of developing Diabetes Mellitus  References:Diagnosis and Classification of Diabetes Mellitus,Diabetes WGNF,6213,08(MVHQI 1):S62-S69 and  Standards of Medical Care in         Diabetes - 2011,Diabetes ONGE,9528,41  (Suppl 1):S11-S61.   Lipid Panel: No results for input(s): CHOL, HDL, LDLCALC, TRIG, CHOLHDL, LDLDIRECT in the last 8760 hours.     Assessment/Plan  1) Altered LOC  -unclear etiology  -she is at risk for pneumonia due to her dysphagia, she could be becoming septic or have had a neurologic event - I called her POA and she stated that  she did not want any aggressive measure taken, only treating small things that provide quality of life and are easily reversible. -We decided to keep her comfortable and not draw any labs, xrays, send to the ED, etc - I left a script for roxanol 5mg  q2 hrs prn for comfort and duoneb BID  -if she does not arouse will d/c all p.o. meds  2) hematuria -no recent symptoms, concern for bladder ca (see goals of care)  3) pancytopenia -noted low wbc's, plt's, and Hgb -?myelodysplasia or other bone marrow issue see #1    Cindi Carbon, [place] [phone]

## 2015-04-12 ENCOUNTER — Non-Acute Institutional Stay (SKILLED_NURSING_FACILITY): Payer: Medicare Other | Admitting: Internal Medicine

## 2015-04-12 ENCOUNTER — Encounter: Payer: Self-pay | Admitting: Internal Medicine

## 2015-04-12 DIAGNOSIS — F0280 Dementia in other diseases classified elsewhere without behavioral disturbance: Secondary | ICD-10-CM

## 2015-04-12 DIAGNOSIS — R404 Transient alteration of awareness: Secondary | ICD-10-CM

## 2015-04-12 DIAGNOSIS — G301 Alzheimer's disease with late onset: Secondary | ICD-10-CM | POA: Diagnosis not present

## 2015-04-12 DIAGNOSIS — R4189 Other symptoms and signs involving cognitive functions and awareness: Secondary | ICD-10-CM

## 2015-04-12 DIAGNOSIS — J9601 Acute respiratory failure with hypoxia: Secondary | ICD-10-CM | POA: Diagnosis not present

## 2015-04-12 NOTE — Progress Notes (Signed)
Patient ID: Nicole Roach, female   DOB: July 08, 1922, 81 y.o.   MRN: [id number]  Location:  [facility] Provider:  Jonelle Sidle L. Mariea Clonts, D.O., C.M.D.  Code Status:  DNR Goals of care: Advanced Directive information Does patient have an advance directive?: Yes, Type of Advance Directive: [place];Living will;Out of facility DNR (pink MOST or yellow form), Pre-existing out of facility DNR order (yellow form or pink MOST form): Yellow form placed in chart (order not valid for inpatient use);Pink MOST form placed in chart (order not valid for inpatient use)  Chief Complaint  Patient presents with  . Acute Visit    acute decreased responsiveness    HPI:  81 yo white female long term care resident of SNF with advance Alzheimer's disease, afib, macular degeneration, OA, diastolic chf, anemia was seen due to decreased responsiveness.  She has been very pale and hypoxic down to 85% on RA.  Her lungs have been clear.  Her goals of care have been reviewed with her daughter who agrees with full comfort, no IVFs and no hospitalizations. She did not want a chest xray performed.  When I saw her, she was alert again and conversive.  She denies chest pain, palpitations, shortness of breath.  Admits to feeling fatigued. Staff were going to see how she did eating her next meal.  She did have a large BM this afternoon, but that was the only thing that changed.  Sats are improved into the 90s.  Review of Systems:  Review of Systems  Constitutional: Positive for malaise/fatigue. Negative for fever and chills.  HENT: Negative for congestion.   Respiratory: Negative for shortness of breath.   Cardiovascular: Negative for chest pain and palpitations.  Gastrointestinal: Negative for abdominal pain and constipation.       Just had bm  Genitourinary: Negative for dysuria, frequency and hematuria.  Musculoskeletal: Negative for falls.  Skin: Negative for rash.  Neurological: Positive for weakness.  Negative for dizziness.  Psychiatric/Behavioral: Positive for memory loss.    Past Medical History  Diagnosis Date  . Alzheimer's disease 12/01/2012  . Osteoarthrosis, unspecified whether generalized or localized, unspecified site 1999  . Unspecified essential hypertension 2000  . Macular degeneration (senile) of retina, unspecified 2005    Left eye, status Roach posterior vitrectomy  . Atrial fibrillation 2008    warfarin DC 2011 due to to anemia  . Unspecified vitamin D deficiency 2009  . Vitreous hemorrhage 2011  . Depressive disorder, not elsewhere classified 2011  . Anemia, unspecified 2011  . Thrombocytopenia, unspecified 2011  . Pneumonia, organism unspecified 2011  . Debility, unspecified 2011  . Diastolic heart failure 5427    2D echocardiogram w/ wevidence of diastolic dysfunction  . Primary pulmonary hypertension 2011  . Lipoma of other skin and subcutaneous tissue 2011    large lipoma rt. mid back  . Leukocytopenia, unspecified 2011  . Impacted cerumen 2011  . Conjunctivitis unspecified 2011  . Ganglion and cyst of synovium, tendon and bursa 2012  . Other B-complex deficiencies 2012  . Unspecified constipation 2013  . Loss of weight 05/2012  . Anemia 12/13/2009    Qualifier: Diagnosis of  By: Lovette Cliche, CNA, Christy  12/13/09: Reviewed lab results with Starla,(POA), anemia worse, no sign of active bleeding , but energy level level remains poor. Likely is slow GI bleed. Reviewed goals of care for Mrs. Nicole Roach. Aggressive workup and treatment of GI lesion is not in the plan. Decision made to d/c Coumadin, recheck CBC.  If anemia continues to worsen (Hgb <8.0) will consider GI consult/or transfusion  2012: B12 supplementation due to MCV elevation, borderline low B12 level . H/H  improved since [place] w/with leukopenia, thrombocytopenia. No sign of active bleeding.  2013: 12/14/11: CBC with lower H/H- platelets improved. 05/27/12: H/H  and platelets stable, no sign of active bleeding.   08/19/12: ASymptomatic, last CBC stable. Recheck 10/2012       Patient Active Problem List   Diagnosis Date Noted  . Allergic rhinitis 02/10/2015  . Dysphagia, pharyngoesophageal phase 09/09/2014  . Hematuria 09/09/2014  . Congestive heart failure with LV diastolic dysfunction, NYHA class 1 ([place]) 06/29/2014  . HCAP (healthcare-associated pneumonia) 06/29/2014  . Other pancytopenia ([place]) 06/22/2014  . Constipation 04/13/2014  . Thrombocytopenia, unspecified ([place])   . Vitreous hemorrhage ([place])   . Alzheimer's disease 12/02/2010  . Anemia 12/13/2009  . Macular degeneration (senile) of retina 12/13/2009  . Essential hypertension 12/13/2009  . ATRIAL FIBRILLATION, PAROXYSMAL 12/13/2009  . OSTEOARTHRITIS 12/13/2009    Allergies  Allergen Reactions  . Avelox [Moxifloxacin Hcl In Nacl]   . Levaquin [Levofloxacin In D5w]   . Quinolones   . Tetanus Toxoids     Medications: Patient's Medications  New Prescriptions   No medications on file  Previous Medications   BRIMONIDINE-TIMOLOL (COMBIGAN) 0.2-0.5 % OPHTHALMIC SOLUTION    Place 1 drop into the left eye 2 (two) times daily.   CHOLECALCIFEROL (VITAMIN D) 2000 UNITS TABLET    Take 2,000 Units by mouth daily.   CLOTRIMAZOLE (LOTRIMIN) 1 % CREAM    Apply 1 application topically. Apply in between toes twice daily for one month   DONEPEZIL (ARICEPT) 10 MG TABLET    Take 5 mg by mouth at bedtime.    FLUTICASONE (FLONASE) 50 MCG/ACT NASAL SPRAY    Place 2 sprays into both nostrils daily.   FUROSEMIDE (LASIX) 40 MG TABLET    Take 60 mg by mouth daily.    MEMANTINE HCL ER (NAMENDA XR) 28 MG CP24    Take 1 capsule by mouth daily.   METOPROLOL TARTRATE (LOPRESSOR) 25 MG TABLET    Take 25 mg by mouth 2 (two) times daily.   MULTIPLE VITAMINS-MINERALS (PRESERVISION AREDS PO)    Take 1 capsule by mouth daily.   NEPAFENAC (NEVANAC) 0.1 % OPHTHALMIC SUSPENSION    Place 1 drop into the left eye 3 (three) times daily.    POLYETHYLENE GLYCOL (MIRALAX /  GLYCOLAX) PACKET    Take 17 g by mouth. 17gram in beverage of choice every other morning for constipation   POTASSIUM CHLORIDE (MICRO-K) 10 MEQ CR CAPSULE    Take 20 mEq by mouth daily.    SENNA (SENOKOT) 8.6 MG TABS    Take 1 tablet by mouth at bedtime.  Modified Medications   No medications on file  Discontinued Medications   METOPROLOL SUCCINATE (TOPROL-XL) 25 MG 24 HR TABLET    Take 1 tablet (25 mg total) by mouth daily.    Physical Exam: There were no vitals filed for this visit. There is no weight on file to calculate BMI.  Physical Exam  Constitutional: She appears well-developed and well-nourished. No distress.  Cardiovascular:  irreg irreg  Pulmonary/Chest: Effort normal and breath sounds normal. No respiratory distress. She has no wheezes. She has no rales.  Abdominal: Soft. Bowel sounds are normal.  Musculoskeletal: Normal range of motion.  Neurological: She is alert.  Skin: There is pallor.  Almost gray skin tone  Psychiatric: She has  a normal mood and affect.    Labs reviewed: Basic Metabolic Panel:  Recent Labs  06/30/14 07/21/14 01/11/15  NA 144 140 143  K 3.8 4.3 3.9  BUN 35* 22* 29*  CREATININE 0.8 0.7 0.9    Liver Function Tests:  Recent Labs  01/11/15  AST 11*  ALT 8  ALKPHOS 70    CBC:  Recent Labs  07/28/14 11/11/14 12/09/14  WBC 2.5 1.8 2.0  HGB 9.1* 8.2* 9.1*  HCT 25* 26* 27*  PLT 64* 42* 56*    Lab Results  Component Value Date   TSH 1.88 08/25/2013   Lab Results  Component Value Date   HGBA1C * 11/02/2009    6.5 (NOTE)                                                                       According to the ADA Clinical Practice Recommendations for 2011, when HbA1c is used as a screening test:   >=6.5%   Diagnostic of Diabetes Mellitus           (if abnormal result  is confirmed)  5.7-6.4%   Increased risk of developing Diabetes Mellitus  References:Diagnosis and Classification of Diabetes Mellitus,Diabetes ALPF,7902,40(XBDZH  1):S62-S69 and Standards of Medical Care in         Diabetes - 2011,Diabetes Care,2011,34  (Suppl 1):S11-S61.   Lab Results  Component Value Date   CHOL  11/03/2009    131        ATP III CLASSIFICATION:  <200     mg/dL   Desirable  200-239  mg/dL   Borderline High  >=240    mg/dL   High          HDL 45 11/03/2009   LDLCALC  11/03/2009    76        Total Cholesterol/HDL:CHD Risk Coronary Heart Disease Risk Table                     Men   Women  1/2 Average Risk   3.4   3.3  Average Risk       5.0   4.4  2 X Average Risk   9.6   7.1  3 X Average Risk  23.4   11.0        Use the calculated Patient Ratio above and the CHD Risk Table to determine the patient's CHD Risk.        ATP III CLASSIFICATION (LDL):  <100     mg/dL   Optimal  100-129  mg/dL   Near or Above                    Optimal  130-159  mg/dL   Borderline  160-189  mg/dL   High  >190     mg/dL   Very High   TRIG 49 11/03/2009   CHOLHDL 2.9 11/03/2009     Patient Care Team: Gayland Curry, DO as PCP - General (Geriatric Medicine) Well Cameron, NP as Nurse Practitioner (Nurse Practitioner)  Assessment/Plan 1. Decreased level of consciousness -and hypoxia -cont comfort measures -her daughter has opted out of a chest xray at this point to further assess and  does not want her mother to receive IVFs to prolong her life -MOST form reflects these wishes  2. Acute respiratory failure with hypoxia (HCC) -?aspiration -unclear if she aspirated or had a mucus plug potentially--brief hypoxia and decreased LOC have now improved, but pt remains pale and gray   3.  Late onset AD without behaviors -is late stage and goals of care have been modified to avoid prolonging her life at this point in favor of comfort and qol -consider hospice care referral  Family/ staff Communication: discussed with snf nursing   Labs/tests ordered:  None per goals of care  Ronnie Mallette L. Kersti Scavone,  D.O. [facility] [address], [place] [address] Cell Phone (Mon-Fri 8am-5pm):  [phone] On Call:  [phone] & follow prompts after 5pm & weekends Office Phone:  [phone] Office Fax:  [phone]

## 2015-04-20 ENCOUNTER — Encounter: Payer: Self-pay | Admitting: Adult Health

## 2015-04-20 NOTE — Progress Notes (Signed)
This encounter was created in error - please disregard.

## 2015-04-24 ENCOUNTER — Encounter: Payer: Self-pay | Admitting: Internal Medicine

## 2015-04-28 ENCOUNTER — Non-Acute Institutional Stay (SKILLED_NURSING_FACILITY): Payer: Medicare Other | Admitting: Adult Health

## 2015-04-28 DIAGNOSIS — I5030 Unspecified diastolic (congestive) heart failure: Secondary | ICD-10-CM

## 2015-04-28 DIAGNOSIS — G301 Alzheimer's disease with late onset: Secondary | ICD-10-CM | POA: Diagnosis not present

## 2015-04-28 DIAGNOSIS — F0280 Dementia in other diseases classified elsewhere without behavioral disturbance: Secondary | ICD-10-CM

## 2015-04-28 DIAGNOSIS — K5901 Slow transit constipation: Secondary | ICD-10-CM | POA: Diagnosis not present

## 2015-04-28 DIAGNOSIS — R627 Adult failure to thrive: Secondary | ICD-10-CM | POA: Diagnosis not present

## 2015-04-28 NOTE — Progress Notes (Signed)
Patient ID: Nicole Roach, female   DOB: [birth year], 81 y.o.   MRN: [id number]      Nursing Home Location:  [facility]   Code Status: DNR with comfort measures   Place of Service: SNF (31)  Chief Complaint  Patient presents with  . Medical Management of Chronic Issues    HPI:  81 y.o. female residing at [facility], skilled care section, seen today for routine follow up on chronic conditions.  She has a hx of AD, CHF diastolic, HTN, afib, pancytopenia and hematuria. On 04/11/15 she had a period of lethargy and decreased intake. Since that time she has not wanted to get oob.  She has lost 18 lbs since August. Her intake is poor and she reports low appetite.  Her family did not want an aggressive work up for this unless there was a clear sign of an easily reversible issue.   She also has a chronic pancytopenia but is asymptomatic at this time with no bruising, fever, etc.  She may have myelodysplastic disorder.  She has not had any aggressive work up for this issue due to her age and debility. She also has a hx of painless hematuria, no recent episodes of this have been reported and she denies pain.  She is pleasant for our visit and has no complaints of pain or otherwise.  Functional status: non ambulatory, incontinent  Review of Systems:  Review of Systems  Constitutional: Positive for appetite change and unexpected weight change. Negative for fever, chills and activity change.  HENT: Negative for congestion, ear pain, nosebleeds, rhinorrhea, sore throat and trouble swallowing.   Respiratory: Negative for cough, shortness of breath and wheezing.   Cardiovascular: Negative for chest pain, palpitations and leg swelling.  Gastrointestinal: Negative for abdominal pain, constipation and abdominal distention.  Genitourinary: Positive for hematuria. Negative for dysuria.  Musculoskeletal: Positive for gait problem. Negative for back pain and  arthralgias.  Skin: Negative for color change and rash.  Neurological: Negative for dizziness, seizures, speech difficulty and light-headedness.  Psychiatric/Behavioral: Positive for confusion. Negative for behavioral problems and agitation. The patient is not nervous/anxious.        Memory loss    Medications: Patient's Medications  New Prescriptions   No medications on file  Previous Medications   BRIMONIDINE-TIMOLOL (COMBIGAN) 0.2-0.5 % OPHTHALMIC SOLUTION    Place 1 drop into the left eye 2 (two) times daily.   CLOTRIMAZOLE (LOTRIMIN) 1 % CREAM    Apply 1 application topically. Apply in between toes twice daily for one month   FUROSEMIDE (LASIX) 40 MG TABLET    Take 60 mg by mouth daily.    NEPAFENAC (NEVANAC) 0.1 % OPHTHALMIC SUSPENSION    Place 1 drop into the left eye 3 (three) times daily.    POLYETHYLENE GLYCOL (MIRALAX / GLYCOLAX) PACKET    Take 17 g by mouth. 17gram in beverage of choice every other morning for constipation   POTASSIUM CHLORIDE (MICRO-K) 10 MEQ CR CAPSULE    Take 20 mEq by mouth daily.    SENNA (SENOKOT) 8.6 MG TABS    Take 1 tablet by mouth at bedtime.  Modified Medications   No medications on file  Discontinued Medications   CHOLECALCIFEROL (VITAMIN D) 2000 UNITS TABLET    Take 2,000 Units by mouth daily.   DONEPEZIL (ARICEPT) 10 MG TABLET    Take 5 mg by mouth at bedtime.    FLUTICASONE (FLONASE) 50 MCG/ACT NASAL SPRAY    Place  2 sprays into both nostrils daily.   MEMANTINE HCL ER (NAMENDA XR) 28 MG CP24    Take 1 capsule by mouth daily.   METOPROLOL TARTRATE (LOPRESSOR) 25 MG TABLET    Take 25 mg by mouth 2 (two) times daily.   MULTIPLE VITAMINS-MINERALS (PRESERVISION AREDS PO)    Take 1 capsule by mouth daily.     Physical Exam:  Filed Vitals:   04/28/15 1432  Weight: 172 lb 8 oz (78.245 kg)   Wt Readings from Last 3 Encounters:  04/28/15 172 lb 8 oz (78.245 kg)  04/24/15 191 lb 3.2 oz (86.728 kg)  03/22/15 189 lb (85.73 kg)     Physical  Exam  Constitutional: No distress.  frail  HENT:  Head: Normocephalic.  Nose: Right sinus exhibits no maxillary sinus tenderness and no frontal sinus tenderness. Left sinus exhibits no maxillary sinus tenderness and no frontal sinus tenderness.  Mouth/Throat: No oropharyngeal exudate or posterior oropharyngeal erythema.    Eyes: Conjunctivae are normal. Pupils are equal, round, and reactive to light. Right eye exhibits no discharge. Left eye exhibits no discharge.  Neck: No JVD present. No tracheal deviation present. No thyromegaly present.  Cardiovascular: Normal rate.  An irregularly irregular rhythm present.  Murmur (systolic 3/6, 2nd ICS, right sternal border) heard. bilat lower ext edema +1  Pulmonary/Chest: Effort normal. No respiratory distress. She has no wheezes.  Abdominal: Soft. Bowel sounds are normal. She exhibits no distension. There is no tenderness.  Musculoskeletal:  Decreased ROM to both shoulders and hips.   Lymphadenopathy:    She has no cervical adenopathy.  Neurological: She is alert. No cranial nerve deficit.  Oriented to self and situation. Able to answer q's but poor historian. Able to f/c  Skin: Skin is warm and dry. She is not diaphoretic. There is pallor.  Psychiatric: Affect normal.    Labs reviewed/Significant Diagnostic Results:  Basic Metabolic Panel:  Recent Labs  06/30/14 07/21/14 01/11/15  NA 144 140 143  K 3.8 4.3 3.9  BUN 35* 22* 29*  CREATININE 0.8 0.7 0.9   Liver Function Tests:  Recent Labs  01/11/15  AST 11*  ALT 8  ALKPHOS 70   No results for input(s): LIPASE, AMYLASE in the last 8760 hours. No results for input(s): AMMONIA in the last 8760 hours. CBC:  Recent Labs  07/28/14 11/11/14 12/09/14  WBC 2.5 1.8 2.0  HGB 9.1* 8.2* 9.1*  HCT 25* 26* 27*  PLT 64* 42* 56*   05/20/14: Iron 81, TIBC 212, %sat 38, B12 364, Folate 11.5, ferritin 292 12/09/14: B12 402, Folate 8.8, Copper 94 12/2014 MMA 240  Assessment/Plan  1. FTT  (failure to thrive) in adult -continues to lose weight -poor appetite and decreased mobility in the setting of AD, as well as unknown pathologic blood disorder and/or ?bladder ca -no pain at this time but has roxanol if needed -goals of care are comfort -Hospice referral (pt has no emotional support other than staff)  2. Congestive heart failure with LV diastolic dysfunction, NYHA class 1 (HCC) -stable, would continue lasix to prevent SOB for comfort  3. Slow transit constipation -controlled with Miralax  4. Late onset Alzheimer's disease without behavioral disturbance -late stage, aricept and namenda d/c'd due to poor condition -still able to communicate and f/c with decrease function over the past few weeks   Cindi Carbon, [place] [phone]

## 2015-06-02 ENCOUNTER — Encounter: Payer: Self-pay | Admitting: Adult Health

## 2015-06-02 ENCOUNTER — Non-Acute Institutional Stay: Payer: Medicare Other | Admitting: Adult Health

## 2015-06-02 DIAGNOSIS — D61818 Other pancytopenia: Secondary | ICD-10-CM | POA: Diagnosis not present

## 2015-06-02 DIAGNOSIS — R627 Adult failure to thrive: Secondary | ICD-10-CM | POA: Diagnosis not present

## 2015-06-02 DIAGNOSIS — G301 Alzheimer's disease with late onset: Secondary | ICD-10-CM

## 2015-06-02 DIAGNOSIS — I5030 Unspecified diastolic (congestive) heart failure: Secondary | ICD-10-CM

## 2015-06-02 DIAGNOSIS — F0280 Dementia in other diseases classified elsewhere without behavioral disturbance: Secondary | ICD-10-CM | POA: Diagnosis not present

## 2015-06-02 NOTE — Progress Notes (Signed)
Patient ID: Nicole Roach, female   DOB: 1922-02-23, 81 y.o.   MRN: [id number]       Nursing Home Location:  [facility]   Code Status: DNR with comfort measures   Place of Service: SNF (31)  Chief Complaint  Patient presents with  . Medical Management of Chronic Issues    HPI:  81 y.o. female residing at [facility], skilled care section, seen today for routine follow up on chronic conditions.  She has a hx of AD, CHF diastolic, HTN, afib, pancytopenia and hematuria. She has overall decline since Sept with increased weakness and lethargy. She had previously lost 18 lbs but has gained 2 lbs in the past month. The staff reports that she is getting out of bed more since my last visit and eating more.  She is currently on a Puree diet with NTL.  She also has a chronic pancytopenia but is asymptomatic at this time with no bruising, fever, etc.  She may have myelodysplastic disorder.  She has not had any aggressive work up for this issue due to her age and debility. She also has a hx of painless hematuria, no recent episodes of this have been reported and she denies pain.  Her POA was offered Hospice but they declined services.  She is pleasant for our visit and has no complaints of pain or otherwise.  Functional status: non ambulatory, incontinent  Review of Systems:  Review of Systems  Constitutional: Positive for appetite change (improved) and unexpected weight change (improved from last visit). Negative for fever, chills and activity change.  HENT: Negative for congestion, ear pain, nosebleeds, rhinorrhea, sore throat and trouble swallowing.   Respiratory: Negative for cough, shortness of breath and wheezing.   Cardiovascular: Negative for chest pain, palpitations and leg swelling.  Gastrointestinal: Negative for abdominal pain, constipation and abdominal distention.  Genitourinary: Positive for hematuria. Negative for dysuria.    Musculoskeletal: Positive for gait problem. Negative for back pain and arthralgias.  Skin: Negative for color change and rash.  Neurological: Positive for weakness (general). Negative for dizziness, seizures, speech difficulty and light-headedness.  Psychiatric/Behavioral: Positive for confusion. Negative for behavioral problems and agitation. The patient is not nervous/anxious.        Memory loss    Medications: Patient's Medications  New Prescriptions   No medications on file  Previous Medications   BRIMONIDINE-TIMOLOL (COMBIGAN) 0.2-0.5 % OPHTHALMIC SOLUTION    Place 1 drop into the left eye 2 (two) times daily.   CLOTRIMAZOLE (LOTRIMIN) 1 % CREAM    Apply 1 application topically. Apply in between toes twice daily for one month   FUROSEMIDE (LASIX) 40 MG TABLET    Take 60 mg by mouth daily.    NEPAFENAC (NEVANAC) 0.1 % OPHTHALMIC SUSPENSION    Place 1 drop into the left eye 3 (three) times daily.    POLYETHYLENE GLYCOL (MIRALAX / GLYCOLAX) PACKET    Take 17 g by mouth. 17gram in beverage of choice every other morning for constipation   POTASSIUM CHLORIDE (MICRO-K) 10 MEQ CR CAPSULE    Take 20 mEq by mouth daily.    SENNA (SENOKOT) 8.6 MG TABS    Take 1 tablet by mouth at bedtime.  Modified Medications   No medications on file  Discontinued Medications   No medications on file     Physical Exam:  Filed Vitals:   06/02/15 1631  Weight: 174 lb (78.926 kg)   Wt Readings from Last 3 Encounters:  06/02/15 174 lb (78.926 kg)  04/28/15 172 lb 8 oz (78.245 kg)  04/24/15 191 lb 3.2 oz (86.728 kg)     Physical Exam  Constitutional: No distress.  frail  HENT:  Head: Normocephalic.  Nose: Right sinus exhibits no maxillary sinus tenderness and no frontal sinus tenderness. Left sinus exhibits no maxillary sinus tenderness and no frontal sinus tenderness.  Mouth/Throat: No oropharyngeal exudate or posterior oropharyngeal erythema.    Eyes: Conjunctivae are normal. Pupils are  equal, round, and reactive to light. Right eye exhibits no discharge. Left eye exhibits no discharge.  Neck: No JVD present. No tracheal deviation present. No thyromegaly present.  Cardiovascular: Normal rate.  An irregularly irregular rhythm present.  Murmur (systolic 3/6, 2nd ICS, right sternal border) heard. bilat lower ext edema +1  Pulmonary/Chest: Effort normal. No respiratory distress. She has no wheezes.  Abdominal: Soft. Bowel sounds are normal. She exhibits no distension. There is no tenderness.  Musculoskeletal:  Decreased ROM to both shoulders and hips.   Lymphadenopathy:    She has no cervical adenopathy.  Neurological: She is alert. No cranial nerve deficit.  Oriented to self and situation. Able to answer q's but poor historian. Able to f/c  Skin: Skin is warm and dry. She is not diaphoretic. There is pallor.  Psychiatric: Affect normal.    Labs reviewed/Significant Diagnostic Results:  Basic Metabolic Panel:  Recent Labs  06/30/14 07/21/14 01/11/15  NA 144 140 143  K 3.8 4.3 3.9  BUN 35* 22* 29*  CREATININE 0.8 0.7 0.9   Liver Function Tests:  Recent Labs  01/11/15  AST 11*  ALT 8  ALKPHOS 70   No results for input(s): LIPASE, AMYLASE in the last 8760 hours. No results for input(s): AMMONIA in the last 8760 hours. CBC:  Recent Labs  07/28/14 11/11/14 12/09/14  WBC 2.5 1.8 2.0  HGB 9.1* 8.2* 9.1*  HCT 25* 26* 27*  PLT 64* 42* 56*   05/20/14: Iron 81, TIBC 212, %sat 38, B12 364, Folate 11.5, ferritin 292 12/09/14: B12 402, Folate 8.8, Copper 94 12/2014 MMA 240  Assessment/Plan  1. FTT (failure to thrive) in adult -improved weight and intake -improved mobility and cognition -continue to monitor, comfort care in place  2. Congestive heart failure with LV diastolic dysfunction, NYHA class 1 (HCC) -stable, would continue lasix to prevent SOB for comfort  3. Slow transit constipation -controlled with Miralax  4. Late onset Alzheimer's disease  without behavioral disturbance -late stage, meds discontinued due to no longer effective  5. Pancytopenia -no signs of bleeding, no further labs due to goals of care   Cindi Carbon, [place] [phone]

## 2015-06-04 NOTE — Progress Notes (Signed)
This encounter was created in error - please disregard.

## 2015-07-04 ENCOUNTER — Non-Acute Institutional Stay: Payer: Medicare Other | Admitting: Adult Health

## 2015-07-04 DIAGNOSIS — R627 Adult failure to thrive: Secondary | ICD-10-CM

## 2015-07-04 DIAGNOSIS — R682 Dry mouth, unspecified: Secondary | ICD-10-CM

## 2015-07-04 DIAGNOSIS — I10 Essential (primary) hypertension: Secondary | ICD-10-CM | POA: Diagnosis not present

## 2015-07-04 DIAGNOSIS — K117 Disturbances of salivary secretion: Secondary | ICD-10-CM | POA: Diagnosis not present

## 2015-07-04 DIAGNOSIS — I350 Nonrheumatic aortic (valve) stenosis: Secondary | ICD-10-CM

## 2015-07-04 DIAGNOSIS — I5030 Unspecified diastolic (congestive) heart failure: Secondary | ICD-10-CM | POA: Diagnosis not present

## 2015-07-07 ENCOUNTER — Encounter: Payer: Self-pay | Admitting: Adult Health

## 2015-07-07 DIAGNOSIS — R682 Dry mouth, unspecified: Secondary | ICD-10-CM

## 2015-07-07 DIAGNOSIS — K117 Disturbances of salivary secretion: Secondary | ICD-10-CM | POA: Insufficient documentation

## 2015-07-07 DIAGNOSIS — I350 Nonrheumatic aortic (valve) stenosis: Secondary | ICD-10-CM | POA: Insufficient documentation

## 2015-07-07 NOTE — Progress Notes (Signed)
Patient ID: Nicole Roach, female   DOB: August 31, 1921, 81 y.o.   MRN: [id number]       Nursing Home Location:  [facility]   Code Status: DNR with comfort measures   Place of Service: SNF (31)  Chief Complaint  Patient presents with  . Medical Management of Chronic Issues    HPI:  81 y.o. female residing at [facility], skilled care section, seen today for routine follow up on chronic conditions.  She has a hx of AD, CHF diastolic, HTN, afib, pancytopenia and hematuria. She has overall decline since Sept with increased weakness and lethargy. She has been losing weight, currently down 3 lbs from our last visit, over 20 lbs in the past year.  She reported pain on her tongue and dry mouth, so biotene was ordered.  This seems to have helped. She also has a chronic pancytopenia but is asymptomatic at this time with no bruising, fever, etc.  She may have myelodysplastic disorder.  She has not had any aggressive work up for this issue due to her age and debility.    Functional status: non ambulatory, incontinent  Review of Systems:  Review of Systems  Constitutional: Positive for appetite change and unexpected weight change. Negative for fever, chills and activity change.  HENT: Negative for congestion, ear pain, nosebleeds, rhinorrhea, sore throat and trouble swallowing.        Dry mouth  Respiratory: Negative for cough, shortness of breath and wheezing.   Cardiovascular: Negative for chest pain, palpitations and leg swelling.  Gastrointestinal: Negative for abdominal pain, constipation and abdominal distention.  Genitourinary: Negative for dysuria.  Musculoskeletal: Positive for gait problem. Negative for back pain and arthralgias.  Skin: Negative for color change and rash.  Neurological: Positive for weakness (general). Negative for dizziness, seizures, speech difficulty and light-headedness.  Psychiatric/Behavioral: Positive for confusion.  Negative for behavioral problems and agitation. The patient is not nervous/anxious.        Memory loss    Medications: Patient's Medications  New Prescriptions   No medications on file  Previous Medications   ANTISEPTIC ORAL RINSE (BIOTENE) LIQD    15 mLs by Mouth Rinse route 3 (three) times daily.   BRIMONIDINE-TIMOLOL (COMBIGAN) 0.2-0.5 % OPHTHALMIC SOLUTION    Place 1 drop into the left eye 2 (two) times daily.   CLOTRIMAZOLE (LOTRIMIN) 1 % CREAM    Apply 1 application topically. Apply in between toes twice daily for one month   FUROSEMIDE (LASIX) 40 MG TABLET    Take 40 mg by mouth daily.    NEPAFENAC (NEVANAC) 0.1 % OPHTHALMIC SUSPENSION    Place 1 drop into the left eye 3 (three) times daily.    POLYETHYLENE GLYCOL (MIRALAX / GLYCOLAX) PACKET    Take 17 g by mouth. 17gram in beverage of choice every other morning for constipation   POTASSIUM CHLORIDE (MICRO-K) 10 MEQ CR CAPSULE    Take 20 mEq by mouth daily.    SENNA (SENOKOT) 8.6 MG TABS    Take 1 tablet by mouth at bedtime.  Modified Medications   No medications on file  Discontinued Medications   No medications on file     Physical Exam:  Filed Vitals:   07/07/15 1641  BP: 152/72  Pulse: 82  Temp: 97.8 F (36.6 C)  Resp: 16  Weight: 171 lb (77.565 kg)   Wt Readings from Last 3 Encounters:  07/07/15 171 lb (77.565 kg)  06/02/15 174 lb (78.926 kg)  04/28/15  172 lb 8 oz (78.245 kg)     Physical Exam  Constitutional: No distress.  frail  HENT:  Head: Normocephalic.  Nose: Right sinus exhibits no maxillary sinus tenderness and no frontal sinus tenderness. Left sinus exhibits no maxillary sinus tenderness and no frontal sinus tenderness.  Mouth/Throat: No oropharyngeal exudate or posterior oropharyngeal erythema.    Very dry mucous membranes  Eyes: Conjunctivae are normal. Pupils are equal, round, and reactive to light. Right eye exhibits no discharge. Left eye exhibits no discharge.  Neck: No JVD present. No  tracheal deviation present. No thyromegaly present.  Cardiovascular: Normal rate.  An irregularly irregular rhythm present.  Murmur (systolic 3/6, 2nd ICS, right sternal border) heard. bilat lower ext edema +1  Pulmonary/Chest: Effort normal. No respiratory distress. She has no wheezes.  Abdominal: Soft. Bowel sounds are normal. She exhibits no distension. There is no tenderness.  Musculoskeletal:  Decreased ROM to both shoulders and hips.   Lymphadenopathy:    She has no cervical adenopathy.  Neurological: She is alert. No cranial nerve deficit.  Oriented to self and situation. Able to answer q's but poor historian. Able to f/c  Skin: Skin is warm and dry. She is not diaphoretic. There is pallor.  Psychiatric: Affect normal.    Labs reviewed/Significant Diagnostic Results:  Basic Metabolic Panel:  Recent Labs  07/21/14 01/11/15  NA 140 143  K 4.3 3.9  BUN 22* 29*  CREATININE 0.7 0.9   Liver Function Tests:  Recent Labs  01/11/15  AST 11*  ALT 8  ALKPHOS 70   No results for input(s): LIPASE, AMYLASE in the last 8760 hours. No results for input(s): AMMONIA in the last 8760 hours. CBC:  Recent Labs  07/28/14 11/11/14 12/09/14  WBC 2.5 1.8 2.0  HGB 9.1* 8.2* 9.1*  HCT 25* 26* 27*  PLT 64* 42* 56*   05/20/14: Iron 81, TIBC 212, %sat 38, B12 364, Folate 11.5, ferritin 292 12/09/14: B12 402, Folate 8.8, Copper 94 12/2014 MMA 240  Assessment/Plan  1. Congestive heart failure with LV diastolic dysfunction, NYHA class 1 (HCC) -no signs of HF at this time, no edema, weight trending down -due to decreased intake and excessive dry mouth will decrease lasix from 60 mg daily to 40 mg daily and monitor for symptoms of sob -goals of care are comfort based  2. FTT (failure to thrive) in adult -continues to lose weight and have periods of weakness where she does not want to get out of bed -no aggressive interventions   3. Essential hypertension -controlled without  meds  4. Xerostomia -continue biotene, see #1  5. Aortic stenosis (mild per echo in 2011) -murmur unchanged, no symptoms at present -continue to monitor   Cindi Carbon, [place] [phone]

## 2015-08-05 ENCOUNTER — Encounter: Payer: Self-pay | Admitting: Adult Health

## 2015-08-05 ENCOUNTER — Non-Acute Institutional Stay: Payer: Medicare Other | Admitting: Adult Health

## 2015-08-05 DIAGNOSIS — F0280 Dementia in other diseases classified elsewhere without behavioral disturbance: Secondary | ICD-10-CM

## 2015-08-05 DIAGNOSIS — I5030 Unspecified diastolic (congestive) heart failure: Secondary | ICD-10-CM | POA: Diagnosis not present

## 2015-08-05 DIAGNOSIS — I482 Chronic atrial fibrillation, unspecified: Secondary | ICD-10-CM

## 2015-08-05 DIAGNOSIS — K117 Disturbances of salivary secretion: Secondary | ICD-10-CM

## 2015-08-05 DIAGNOSIS — R627 Adult failure to thrive: Secondary | ICD-10-CM

## 2015-08-05 DIAGNOSIS — R682 Dry mouth, unspecified: Secondary | ICD-10-CM

## 2015-08-05 DIAGNOSIS — G301 Alzheimer's disease with late onset: Secondary | ICD-10-CM

## 2015-08-11 ENCOUNTER — Encounter: Payer: Self-pay | Admitting: Adult Health

## 2015-08-11 NOTE — Progress Notes (Signed)
Patient ID: Nicole Roach, female   DOB: 04-02-1922, 82 y.o.   MRN: [id number]       Nursing Home Location:  [facility]   Code Status: DNR with comfort measures   Place of Service: SNF (31)  Chief Complaint  Patient presents with  . Medical Management of Chronic Issues    HPI:  82 y.o. female residing at [facility], skilled care section, seen today for routine follow up on chronic conditions.  She has a hx of AD, CHF diastolic, HTN, afib, pancytopenia and hematuria. Resident is currently on lasix 40 mg daily for CHF, does not have symptoms of SOB or CP. Dose was reduced at her last visit due to dry mouth and generally dehydration with decreased intake. Now has mild edema to ankles but no change in resp status. She has overall decline since Sept with increased weakness and lethargy. She has been losing weight, 11 lbs since November. She also has a chronic pancytopenia but is asymptomatic at this time with no bruising, fever, etc.  She may have myelodysplastic disorder.  She has not had any aggressive work up for this issue due to her age and debility. Also has a hx of painless hematuria that was not worked up for the same reason, however, there have been no reports of this in the past month.   Functional status: non ambulatory, incontinent  Review of Systems:  Review of Systems  Constitutional: Positive for appetite change and unexpected weight change. Negative for fever, chills and activity change.  HENT: Negative for congestion, ear pain, nosebleeds, rhinorrhea, sore throat and trouble swallowing.        Dry mouth  Respiratory: Negative for cough, shortness of breath and wheezing.   Cardiovascular: Negative for chest pain, palpitations and leg swelling.  Gastrointestinal: Negative for abdominal pain, constipation and abdominal distention.  Genitourinary: Negative for dysuria.  Musculoskeletal: Positive for gait problem. Negative for back  pain and arthralgias.  Skin: Negative for color change and rash.  Neurological: Positive for weakness (general). Negative for dizziness, seizures, speech difficulty and light-headedness.  Psychiatric/Behavioral: Positive for confusion. Negative for behavioral problems and agitation. The patient is not nervous/anxious.        Memory loss    Medications: Patient's Medications  New Prescriptions   No medications on file  Previous Medications   ANTISEPTIC ORAL RINSE (BIOTENE) LIQD    15 mLs by Mouth Rinse route 3 (three) times daily.   BRIMONIDINE-TIMOLOL (COMBIGAN) 0.2-0.5 % OPHTHALMIC SOLUTION    Place 1 drop into the left eye 2 (two) times daily.   CLOTRIMAZOLE (LOTRIMIN) 1 % CREAM    Apply 1 application topically. Apply in between toes twice daily for one month   FUROSEMIDE (LASIX) 40 MG TABLET    Take 40 mg by mouth daily.    NEPAFENAC (NEVANAC) 0.1 % OPHTHALMIC SUSPENSION    Place 1 drop into the left eye 3 (three) times daily.    POLYETHYLENE GLYCOL (MIRALAX / GLYCOLAX) PACKET    Take 17 g by mouth. 17gram in beverage of choice every other morning for constipation   POTASSIUM CHLORIDE (MICRO-K) 10 MEQ CR CAPSULE    Take 20 mEq by mouth daily.    SENNA (SENOKOT) 8.6 MG TABS    Take 1 tablet by mouth at bedtime.  Modified Medications   No medications on file  Discontinued Medications   No medications on file     Physical Exam:  Filed Vitals:   08/11/15 1618  BP: 128/58  Pulse: 72  Temp: 96.5 F (35.8 C)  Resp: 16  Weight: 163 lb 12.8 oz (74.299 kg)  SpO2: 94%   Wt Readings from Last 3 Encounters:  08/11/15 163 lb 12.8 oz (74.299 kg)  07/07/15 171 lb (77.565 kg)  06/02/15 174 lb (78.926 kg)     Physical Exam  Constitutional: No distress.  HENT:  Head: Normocephalic and atraumatic.  Mouth/Throat: Oropharynx is clear and moist. No oropharyngeal exudate.  Neck: Normal range of motion. Neck supple.  Cardiovascular:  Murmur (systolic 3/6) heard. +1 BLE edema L>R,  irregular rhythm  Pulmonary/Chest: Effort normal and breath sounds normal.  Abdominal: Soft. Bowel sounds are normal. She exhibits no distension.  Musculoskeletal: She exhibits no edema or tenderness.  Generally weak  Neurological: She is alert. No cranial nerve deficit.  Oriented to self and situation, able to f/c  Skin: Skin is warm and dry. No rash noted. She is not diaphoretic. There is pallor.  Psychiatric: Affect normal.    Labs reviewed/Significant Diagnostic Results:  Basic Metabolic Panel:  Recent Labs  01/11/15  NA 143  K 3.9  BUN 29*  CREATININE 0.9   Liver Function Tests:  Recent Labs  01/11/15  AST 11*  ALT 8  ALKPHOS 70   No results for input(s): LIPASE, AMYLASE in the last 8760 hours. No results for input(s): AMMONIA in the last 8760 hours. CBC:  Recent Labs  11/11/14 12/09/14  WBC 1.8 2.0  HGB 8.2* 9.1*  HCT 26* 27*  PLT 42* 56*   05/20/14: Iron 81, TIBC 212, %sat 38, B12 364, Folate 11.5, ferritin 292 12/09/14: B12 402, Folate 8.8, Copper 94 12/2014 MMA 240  Assessment/Plan  1. Congestive heart failure with LV diastolic dysfunction, NYHA class 1 (HCC) -stable despite lasix dose reduction -continue current dose, used for treatment of symptoms, as goals of care are comfort  2. Chronic atrial fibrillation (HCC) -rate controlled without meds -not on anticoag due to hx of hematuria, low plts, etc  3. Xerostomia -improved, continue biotene  4. Late onset Alzheimer's disease without behavioral disturbance -advanced, no longer on meds -goals of care are comfort based -resident remains pleasant and verbal but is no longer ambulatory -[facility] has declined hospice services  5. FTT (failure to thrive) in adult -weight continues to decline but resident condition has stabilized and no longer appears to be in the active dying process     Cindi Carbon, [place] [phone]

## 2015-09-05 ENCOUNTER — Encounter: Payer: Self-pay | Admitting: Adult Health

## 2015-09-05 ENCOUNTER — Non-Acute Institutional Stay: Payer: Medicare Other | Admitting: Adult Health

## 2015-09-05 DIAGNOSIS — I5030 Unspecified diastolic (congestive) heart failure: Secondary | ICD-10-CM

## 2015-09-05 DIAGNOSIS — K5901 Slow transit constipation: Secondary | ICD-10-CM | POA: Diagnosis not present

## 2015-09-05 DIAGNOSIS — R627 Adult failure to thrive: Secondary | ICD-10-CM | POA: Diagnosis not present

## 2015-09-05 DIAGNOSIS — R1314 Dysphagia, pharyngoesophageal phase: Secondary | ICD-10-CM | POA: Diagnosis not present

## 2015-09-05 NOTE — Progress Notes (Signed)
Patient ID: Nicole Roach, female   DOB: Dec 23, 1921, 82 y.o.   MRN: [id number]    Nursing Home Location:    [facility]  Code Status: DNR, comfort care  Patient Care Team: Gayland Curry, DO as PCP - General (Geriatric Medicine) Well [place], NP as Nurse Practitioner (Nurse Practitioner)  Goals of care: Advanced Directive information Advanced Directives 04/12/2015  Does patient have an advance directive? Yes  Type of Paramedic of [place];Living will;Out of facility DNR (pink MOST or yellow form)  Copy of advanced directive(s) in chart? Yes  Pre-existing out of facility DNR order (yellow form or pink MOST form) Yellow form placed in chart (order not valid for inpatient use);Pink MOST form placed in chart (order not valid for inpatient use)     Place of Service:  SNF  Chief Complaint  Patient presents with  . Medical Management of Chronic Issues    HPI:  82 y.o. female residing at [facility], skilled care section. I am here to review her chronic medical issues. She is sitting up in bed and drinking milk. She states she feels fine. She denies pain. Her caregiver is at the bedside.   1. Dysphagia, pharyngoesophageal phase She is a pureed diet with nectar thick liquids. She has no recent hx of aspiration. Staff reports that she ate 100% of her breakfast, but not as much lunch today. She has lost about 8 lb since 07/07/15. She is taking Biotene for her xerostomia.  2. Congestive heart failure with LV diastolic dysfunction, NYHA class 1 ([place]) She has had 2 episodes of hypoxia with oxygen saturations in low 80s. She also had an unresponsive event as reported by a CNA on 1/29 where she did not speak or respond to stimulus. Her oxygen saturation is 92% on room air for today's visit. Her lasix dose was decreased to 40mg  qd on 12/12. She has slight expiratory wheezes to upper lobes. She has no  crackles or rales noted on ausculation. She has +2 edema to bilat lower extremities. She denies shortness of breath or difficulty breathing.   3. FTT (failure to thrive) in adult Staff reports that pt is not getting up out of bed and sitting in her chair as much as she used to. States she prefers to remain in bed when offered to be placed in chair.    4. Slow transit constipation She has had no change in BMs. Her last BM was yesterday.     Review of Systems:  Review of Systems  Constitutional: Positive for activity change, appetite change and unexpected weight change. Negative for fever.  HENT: Negative for facial swelling and mouth sores.   Eyes: Negative for discharge and redness.  Respiratory: Negative for cough, choking and shortness of breath.   Cardiovascular: Positive for leg swelling. Negative for chest pain.  Neurological: Negative for speech difficulty.  Psychiatric/Behavioral: Negative for dysphoric mood and agitation. The patient is not nervous/anxious.   All other systems reviewed and are negative.   Medications: Patient's Medications  New Prescriptions   No medications on file  Previous Medications   ANTISEPTIC ORAL RINSE (BIOTENE) LIQD    15 mLs by Mouth Rinse route 3 (three) times daily.   BRIMONIDINE-TIMOLOL (COMBIGAN) 0.2-0.5 % OPHTHALMIC SOLUTION    Place 1 drop into the left eye 2 (two) times daily.   FUROSEMIDE (LASIX) 40 MG TABLET    Take 40 mg by mouth daily.    IPRATROPIUM-ALBUTEROL (  DUONEB) 0.5-2.5 (3) MG/3ML SOLN    Take 3 mLs by nebulization every 6 (six) hours as needed.   MORPHINE (ROXANOL) 20 MG/ML CONCENTRATED SOLUTION    Take 5 mg by mouth every 2 (two) hours as needed for severe pain.   NEPAFENAC (NEVANAC) 0.1 % OPHTHALMIC SUSPENSION    Place 1 drop into the left eye 3 (three) times daily.    POLYETHYLENE GLYCOL (MIRALAX / GLYCOLAX) PACKET    Take 17 g by mouth. 17gram in beverage of choice every other morning for constipation   POTASSIUM CHLORIDE  (MICRO-K) 10 MEQ CR CAPSULE    Take 20 mEq by mouth daily.    SENNA (SENOKOT) 8.6 MG TABS    Take 1 tablet by mouth at bedtime.  Modified Medications   No medications on file  Discontinued Medications   CLOTRIMAZOLE (LOTRIMIN) 1 % CREAM    Apply 1 application topically. Apply in between toes twice daily for one month     Physical Exam:  Filed Vitals:   09/05/15 1406  BP: 135/60  Pulse: 74  Temp: 97.5 F (36.4 C)  Resp: 20  Weight: 163 lb 12.8 oz (74.299 kg)  SpO2: 92%    Physical Exam  Constitutional: No distress.  HENT:  Head: Normocephalic and atraumatic.  Mouth/Throat: No oropharyngeal exudate.  Eyes: Pupils are equal, round, and reactive to light.  Neck: Normal range of motion. Neck supple.  Cardiovascular: Intact distal pulses.   Murmur heard. Pulmonary/Chest: Effort normal. No respiratory distress. She has wheezes. She has no rales.  Abdominal: Soft. Bowel sounds are normal. She exhibits no distension. There is no tenderness.  Musculoskeletal: Normal range of motion. She exhibits edema.  +2 edema to bilat lower extremities  Neurological: She is alert.  confused  Skin: Skin is warm and dry. She is not diaphoretic.  Psychiatric: Mood and affect normal.  Nursing note and vitals reviewed.   Wt Readings from Last 3 Encounters:  09/05/15 163 lb 12.8 oz (74.299 kg)  08/11/15 163 lb 12.8 oz (74.299 kg)  07/07/15 171 lb (77.565 kg)     Labs reviewed/Significant Diagnostic Results:  Basic Metabolic Panel:  Recent Labs  01/11/15  NA 143  K 3.9  BUN 29*  CREATININE 0.9   Liver Function Tests:  Recent Labs  01/11/15  AST 11*  ALT 8  ALKPHOS 70   No results for input(s): LIPASE, AMYLASE in the last 8760 hours. No results for input(s): AMMONIA in the last 8760 hours. CBC:  Recent Labs  11/11/14 12/09/14  WBC 1.8 2.0  HGB 8.2* 9.1*  HCT 26* 27*  PLT 42* 56*   CBG: No results for input(s): GLUCAP in the last 8760 hours. TSH: No results for  input(s): TSH in the last 8760 hours. A1C: Lab Results  Component Value Date   HGBA1C * 11/02/2009    6.5 (NOTE)                                                                       According to the ADA Clinical Practice Recommendations for 2011, when HbA1c is used as a screening test:   >=6.5%   Diagnostic of Diabetes Mellitus           (if abnormal  result  is confirmed)  5.7-6.4%   Increased risk of developing Diabetes Mellitus  References:Diagnosis and Classification of Diabetes Mellitus,Diabetes [id number] 1):S62-S69 and Standards of Medical Care in         Diabetes - 2011,Diabetes [id number]  (Suppl 1):S11-S61.   Lipid Panel: No results for input(s): CHOL, HDL, LDLCALC, TRIG, CHOLHDL, LDLDIRECT in the last 8760 hours.     Assessment/Plan  1. Dysphagia, pharyngoesophageal phase - stable -continue pureed diet with nectar thick liquids -continue monitoring for aspiration   2. Congestive heart failure with LV diastolic dysfunction, NYHA class 1 (HCC) -stable -continue Lasix dose at 40 mg qd. -monitor for difficulty breathing. May use oxygen for increased work of breathing at 2L via nasal cannula.   3. FTT (failure to thrive) in adult -continues to lose weight and have periods of lethargy and increased WOB -continue comfort measures -update MOST form per POA  4. Slow transit constipation -controlled -Continue Senna and Miralax     Laurin Coder, RN/DNP student Cindi Carbon, ANP [facility] [phone]

## 2015-09-27 ENCOUNTER — Encounter: Payer: Self-pay | Admitting: Internal Medicine

## 2015-09-27 ENCOUNTER — Non-Acute Institutional Stay (SKILLED_NURSING_FACILITY): Payer: Medicare Other | Admitting: Internal Medicine

## 2015-09-27 DIAGNOSIS — J209 Acute bronchitis, unspecified: Secondary | ICD-10-CM | POA: Diagnosis not present

## 2015-09-27 DIAGNOSIS — J9601 Acute respiratory failure with hypoxia: Secondary | ICD-10-CM | POA: Diagnosis not present

## 2015-09-27 NOTE — Progress Notes (Signed)
Patient ID: Nicole Roach, female   DOB: 02-Jul-1922, 82 y.o.   MRN: [id number]  Location:  [place] Room Number: 126 Place of Service:  SNF (31) Provider:  Hollace Kinnier, DO  Patient Care Team: Gayland Curry, DO as PCP - General (Geriatric Medicine) Well [place], NP as Nurse Practitioner (Nurse Practitioner)  Extended Emergency Contact Information Primary Emergency Contact: Clement,Starla Address:              [address] [place] Phone: [phone] Mobile Phone: [phone] Relation: Relative  Code Status:  DNR Goals of care: Advanced Directive information Advanced Directives 09/27/2015  Does patient have an advance directive? Yes  Type of Paramedic of [place];Living will;Out of facility DNR (pink MOST or yellow form)  Copy of advanced directive(s) in chart? -  Pre-existing out of facility DNR order (yellow form or pink MOST form) Yellow form placed in chart (order not valid for inpatient use);Pink MOST form placed in chart (order not valid for inpatient use)    Chief Complaint  Patient presents with  . Acute Visit    congestion, weakness, grey in color, cough, decrease in O2 sat    HPI:  Pt is a 82 y.o. female seen today for an acute visit for cough, congestion that is grey in color, low sats of 70% on RA (up to 96-97 on 2L [place]).  RR 22.  She has had poor po intake for the past two weeks also.  She is on comfort care.  Her last bm was yesterday.  She had a dose of morphine at 10:330am.  She's been weak.  Her nurse has already contacted her family who desired for continued comfort measures--no aggressive workup or treatments.     Past Medical History  Diagnosis Date  . Alzheimer's disease 12/01/2012  . Osteoarthrosis, unspecified whether generalized or localized, unspecified site 1999  . Unspecified essential hypertension 2000  . Macular degeneration (senile) of  retina, unspecified 2005    Left eye, status post posterior vitrectomy  . Atrial fibrillation ([place]) 2008    warfarin DC 2011 due to to anemia  . Unspecified vitamin D deficiency 2009  . Vitreous hemorrhage ([place]) 2011  . Depressive disorder, not elsewhere classified 2011  . Anemia, unspecified 2011  . Thrombocytopenia, unspecified ([place]) 2011  . Pneumonia, organism unspecified 2011  . Debility, unspecified 2011  . Diastolic heart failure ([place]) 2011    2D echocardiogram w/ wevidence of diastolic dysfunction  . Primary pulmonary hypertension ([place]) 2011  . Lipoma of other skin and subcutaneous tissue 2011    large lipoma rt. mid back  . Leukocytopenia, unspecified 2011  . Impacted cerumen 2011  . Conjunctivitis unspecified 2011  . Ganglion and cyst of synovium, tendon and bursa 2012  . Other B-complex deficiencies 2012  . Unspecified constipation 2013  . Loss of weight 05/2012  . Anemia 12/13/2009    Qualifier: Diagnosis of  By: Lovette Cliche, CNA, Christy  12/13/09: Reviewed lab results with Starla,(POA), anemia worse, no sign of active bleeding , but energy level level remains poor. Likely is slow GI bleed. Reviewed goals of care for [name]. Aggressive workup and treatment of GI lesion is not in the plan. Decision made to d/c Coumadin, recheck CBC. If anemia continues to worsen (Hgb <8.0) will consider GI consult/or transfusion  2012: B12 supplementation due to MCV elevation, borderline low B12 level . H/H  improved since [place] w/with leukopenia, thrombocytopenia. No  sign of active bleeding.  2013: 12/14/11: CBC with lower H/H- platelets improved. 05/27/12: H/H  and platelets stable, no sign of active bleeding.  08/19/12: ASymptomatic, last CBC stable. Recheck 10/2012      Past Surgical History  Procedure Laterality Date  . Tonsillectomy and adenoidectomy  1927  . Thyroidectomy, partial  1950s  . Total hip arthroplasty Right 1999  . Cataract extraction w/ intraocular lens  implant, bilateral  Bilateral 2002  . Vitrectomy Left 2005    Posterior vitrectomy and repair  . Vitrectomy Left 2/ 2011    Allergies  Allergen Reactions  . Avelox [Moxifloxacin Hcl In Nacl]   . Levaquin [Levofloxacin In D5w]   . Quinolones   . Tetanus Toxoids       Medication List       This list is accurate as of: 09/27/15 11:59 PM.  Always use your most recent med list.               antiseptic oral rinse Liqd  15 mLs by Mouth Rinse route 3 (three) times daily.     brimonidine-timolol 0.2-0.5 % ophthalmic solution  Commonly known as:  COMBIGAN  Place 1 drop into the left eye 2 (two) times daily.     furosemide 40 MG tablet  Commonly known as:  LASIX  Take 40 mg by mouth daily.     ipratropium-albuterol 0.5-2.5 (3) MG/3ML Soln  Commonly known as:  DUONEB  Take 3 mLs by nebulization every 6 (six) hours as needed.     morphine 20 MG/ML concentrated solution  Commonly known as:  ROXANOL  Take 5 mg by mouth every 2 (two) hours as needed for severe pain.     nepafenac 0.1 % ophthalmic suspension  Commonly known as:  NEVANAC  Place 1 drop into the left eye 3 (three) times daily.     polyethylene glycol packet  Commonly known as:  MIRALAX / GLYCOLAX  Take 17 g by mouth. 17gram in beverage of choice every other morning for constipation     potassium chloride 10 MEQ CR capsule  Commonly known as:  MICRO-K  Take 20 mEq by mouth daily.     senna 8.6 MG Tabs tablet  Commonly known as:  SENOKOT  Take 1 tablet by mouth at bedtime.        Review of Systems  Constitutional: Positive for appetite change and fatigue. Negative for fever and chills.  HENT: Positive for congestion. Negative for sore throat.   Eyes: Negative for redness.  Respiratory: Positive for cough and shortness of breath. Negative for chest tightness and wheezing.   Cardiovascular: Negative for chest pain and leg swelling.  Gastrointestinal: Negative for abdominal pain.  Genitourinary: Negative for dysuria.  Skin:  Positive for color change.  Neurological: Positive for weakness.    Immunization History  Administered Date(s) Administered  . Influenza Whole 05/06/2013  . Influenza-Unspecified 04/27/2014, 05/05/2015  . PPD Test 11/29/2011  . Pneumococcal Polysaccharide-23 08/14/2011   Pertinent  Health Maintenance Due  Topic Date Due  . DEXA SCAN  12/29/1988  . PNA vac Low Risk Adult (2 of 2 - PCV13) 08/13/2012  . INFLUENZA VACCINE  02/21/2016   Fall Risk  02/10/2015 01/10/2015  Falls in the past year? No No  Risk for fall due to : History of fall(s) History of fall(s)   Functional Status Survey: Is the patient deaf or have difficulty hearing?: No Does the patient have difficulty seeing, even when wearing glasses/contacts?: No Does the  patient have difficulty concentrating, remembering, or making decisions?: Yes Does the patient have difficulty walking or climbing stairs?: Yes Does the patient have difficulty dressing or bathing?: Yes Does the patient have difficulty doing errands alone such as visiting a doctor's office or shopping?: Yes  Filed Vitals:   09/27/15 1348  BP: 130/72  Pulse: 85  Temp: 96.6 F (35.9 C)  Resp: 22  Height: 5\' 7"  (1.702 m)  Weight: 163 lb (73.936 kg)  SpO2: 96%   Body mass index is 25.52 kg/(m^2). Physical Exam  Constitutional: No distress.  Cardiovascular: Normal rate and regular rhythm.   Pulmonary/Chest:  Diminished breath sounds at the bases, some rhonchi anteriorly and increased RR, but denies discomfort  Abdominal: Soft. Bowel sounds are normal.  Musculoskeletal: Normal range of motion.  Neurological: She is alert.  Skin: Skin is warm and dry. There is pallor.  Psychiatric: She has a normal mood and affect.    Labs reviewed:  Recent Labs  01/11/15  NA 143  K 3.9  BUN 29*  CREATININE 0.9    Recent Labs  01/11/15  AST 11*  ALT 8  ALKPHOS 70    Recent Labs  11/11/14 12/09/14  WBC 1.8 2.0  HGB 8.2* 9.1*  HCT 26* 27*  PLT 42* 56*     Lab Results  Component Value Date   TSH 1.88 08/25/2013   Lab Results  Component Value Date   HGBA1C * 11/02/2009    6.5 (NOTE)                                                                       According to the ADA Clinical Practice Recommendations for 2011, when HbA1c is used as a screening test:   >=6.5%   Diagnostic of Diabetes Mellitus           (if abnormal result  is confirmed)  5.7-6.4%   Increased risk of developing Diabetes Mellitus  References:Diagnosis and Classification of Diabetes Mellitus,Diabetes [id number] 1):S62-S69 and Standards of Medical Care in         Diabetes - 2011,Diabetes Care,2011,34  (Suppl 1):S11-S61.   Lab Results  Component Value Date   CHOL  11/03/2009    131        ATP III CLASSIFICATION:  <200     mg/dL   Desirable  200-239  mg/dL   Borderline High  >=240    mg/dL   High          HDL 45 11/03/2009   LDLCALC  11/03/2009    76        Total Cholesterol/HDL:CHD Risk Coronary Heart Disease Risk Table                     Men   Women  1/2 Average Risk   3.4   3.3  Average Risk       5.0   4.4  2 X Average Risk   9.6   7.1  3 X Average Risk  23.4   11.0        Use the calculated Patient Ratio above and the CHD Risk Table to determine the patient's CHD Risk.        ATP III CLASSIFICATION (LDL):  <  100     mg/dL   Optimal  100-129  mg/dL   Near or Above                    Optimal  130-159  mg/dL   Borderline  160-189  mg/dL   High  >190     mg/dL   Very High   TRIG 49 11/03/2009   CHOLHDL 2.9 11/03/2009    Assessment/Plan 1. Acute respiratory failure with hypoxia (HCC) -continue oxygen as long as desired by family -also continue morphine for comfort  -avoid xrays, labs -also use duonebs to help loosen secretions and maintain comfort  2. Acute bronchitis, unspecified organism -see above -seems to be cause of #1, but could also have been an aspiration episode -does not seem to have signs and symptoms of acute chf and sputum  is discolored suggestive of this not chf  Family/ staff Communication: pt's nurse has called her family and agree with comfort measures  Labs/tests ordered:  none

## 2015-11-03 ENCOUNTER — Non-Acute Institutional Stay (SKILLED_NURSING_FACILITY): Payer: Medicare Other | Admitting: Adult Health

## 2015-11-03 ENCOUNTER — Encounter: Payer: Self-pay | Admitting: Adult Health

## 2015-11-03 DIAGNOSIS — D61818 Other pancytopenia: Secondary | ICD-10-CM

## 2015-11-03 DIAGNOSIS — G301 Alzheimer's disease with late onset: Secondary | ICD-10-CM

## 2015-11-03 DIAGNOSIS — F0280 Dementia in other diseases classified elsewhere without behavioral disturbance: Secondary | ICD-10-CM | POA: Diagnosis not present

## 2015-11-03 DIAGNOSIS — I350 Nonrheumatic aortic (valve) stenosis: Secondary | ICD-10-CM | POA: Diagnosis not present

## 2015-11-03 DIAGNOSIS — R627 Adult failure to thrive: Secondary | ICD-10-CM | POA: Diagnosis not present

## 2015-11-03 DIAGNOSIS — R1314 Dysphagia, pharyngoesophageal phase: Secondary | ICD-10-CM | POA: Diagnosis not present

## 2015-11-03 NOTE — Assessment & Plan Note (Signed)
No further bleeding episodes. No labs due to goals of care.

## 2015-11-03 NOTE — Assessment & Plan Note (Signed)
No chest pain or sob. Requires oxygen at 2L. No medical intervention. BP ok.

## 2015-11-03 NOTE — Assessment & Plan Note (Signed)
Weight trending down, continues to have a poor appetite and refuses to get OOB.  Continue with comfort care.  Family does not want Hospice.

## 2015-11-03 NOTE — Progress Notes (Signed)
Patient ID: Nicole Roach, female   DOB: [birth year], 82 y.o.   MRN: [id number]    Nursing Home Location:  [place]   Code Status: DNR, comfort care  Patient Care Team: Gayland Curry, DO as PCP - General (Geriatric Medicine) Well [place], NP as Nurse Practitioner (Nurse Practitioner)  Goals of care: Advanced Directive information Advanced Directives 09/27/2015  Does patient have an advance directive? Yes  Type of Paramedic of [place];Living will;Out of facility DNR (pink MOST or yellow form)  Copy of advanced directive(s) in chart? -  Pre-existing out of facility DNR order (yellow form or pink MOST form) Yellow form placed in chart (order not valid for inpatient use);Pink MOST form placed in chart (order not valid for inpatient use)     Place of Service: SNF (31)  Chief Complaint  Patient presents with  . Medical Management of Chronic Issues    HPI:  82 y.o. female residing at [facility], skilled care section. I am here to review her chronic medical issues. She has a hx of advanced dementia, chf, dysphagia, hematuria, and pancytopenia.  Her goals of care are comfort, she is a DNR  1. FTT (failure to thrive) in adult -weight continues to trend down, currently at 161 lbs -refuses to get oob or eat at times -does not like supplements  2. Other pancytopenia ([place]) -never worked up due to age/debility -has hx of hematuria but resolved on its own -resident denies pain and has morphine if needed for sob  3. Late onset Alzheimer's disease without behavioral disturbance -not one meds -non ambulatory, remains verbal, needs assistance for ADL's  4. Dysphagia, pharyngoesophageal phase -had choking episode in March -typically tolerates D2 diet with NTL    5. Aortic stenosis -has significant murmur on exam with a hx of CHF -echo in 2011 shows mild stenosis with PAP of 48,  suspect this is worse now -on 02 at 2L,    Review of Systems:  Review of Systems  Constitutional: Positive for activity change, appetite change and unexpected weight change. Negative for fever.  HENT: Negative for facial swelling and mouth sores.   Eyes: Negative for discharge and redness.  Respiratory: Negative for cough, choking and shortness of breath.   Cardiovascular: Positive for leg swelling. Negative for chest pain.  Neurological: Negative for speech difficulty.  Psychiatric/Behavioral: Negative for dysphoric mood and agitation. The patient is not nervous/anxious.   All other systems reviewed and are negative.   Medications: Patient's Medications  New Prescriptions   No medications on file  Previous Medications   ANTISEPTIC ORAL RINSE (BIOTENE) LIQD    15 mLs by Mouth Rinse route 3 (three) times daily.   BRIMONIDINE-TIMOLOL (COMBIGAN) 0.2-0.5 % OPHTHALMIC SOLUTION    Place 1 drop into the left eye 2 (two) times daily.   FUROSEMIDE (LASIX) 40 MG TABLET    Take 40 mg by mouth daily.    IPRATROPIUM-ALBUTEROL (DUONEB) 0.5-2.5 (3) MG/3ML SOLN    Take 3 mLs by nebulization every 6 (six) hours as needed.   MORPHINE (ROXANOL) 20 MG/ML CONCENTRATED SOLUTION    Take 5 mg by mouth every 2 (two) hours as needed for severe pain.   NEPAFENAC (NEVANAC) 0.1 % OPHTHALMIC SUSPENSION    Place 1 drop into the left eye 3 (three) times daily.    POLYETHYLENE GLYCOL (MIRALAX / GLYCOLAX) PACKET    Take 17 g by mouth. 17gram in beverage of choice every other  morning for constipation   POTASSIUM CHLORIDE (MICRO-K) 10 MEQ CR CAPSULE    Take 20 mEq by mouth daily.    SENNA (SENOKOT) 8.6 MG TABS    Take 1 tablet by mouth at bedtime.  Modified Medications   No medications on file  Discontinued Medications   No medications on file     Physical Exam:  Filed Vitals:   11/03/15 1108  BP: 126/58  Pulse: 68  Temp: 97 F (36.1 C)  Resp: 17  Weight: 161 lb 12.8 oz (73.392 kg)  SpO2: 96%     Physical Exam  Constitutional: No distress.  HENT:  Head: Normocephalic and atraumatic.  Mouth/Throat: No oropharyngeal exudate.  Eyes: Pupils are equal, round, and reactive to light.  Neck: Normal range of motion. Neck supple.  Cardiovascular: Intact distal pulses.   Murmur heard. Pulmonary/Chest: Effort normal. No respiratory distress. She has wheezes. She has no rales.  Abdominal: Soft. Bowel sounds are normal. She exhibits no distension. There is no tenderness.  Musculoskeletal: Normal range of motion. She exhibits edema.  +2 edema to bilat lower extremities  Neurological: She is alert.  confused  Skin: Skin is warm and dry. She is not diaphoretic.  Psychiatric: Mood and affect normal.  Nursing note and vitals reviewed.   Wt Readings from Last 3 Encounters:  11/03/15 161 lb 12.8 oz (73.392 kg)  09/27/15 163 lb (73.936 kg)  09/05/15 163 lb 12.8 oz (74.299 kg)     Labs reviewed/Significant Diagnostic Results:  Basic Metabolic Panel:  Recent Labs  01/11/15  NA 143  K 3.9  BUN 29*  CREATININE 0.9   Liver Function Tests:  Recent Labs  01/11/15  AST 11*  ALT 8  ALKPHOS 70   No results for input(s): LIPASE, AMYLASE in the last 8760 hours. No results for input(s): AMMONIA in the last 8760 hours. CBC:  Recent Labs  11/11/14 12/09/14  WBC 1.8 2.0  HGB 8.2* 9.1*  HCT 26* 27*  PLT 42* 56*   CBG: No results for input(s): GLUCAP in the last 8760 hours. TSH: No results for input(s): TSH in the last 8760 hours. A1C: Lab Results  Component Value Date   HGBA1C * 11/02/2009    6.5 (NOTE)                                                                       According to the ADA Clinical Practice Recommendations for 2011, when HbA1c is used as a screening test:   >=6.5%   Diagnostic of Diabetes Mellitus           (if abnormal result  is confirmed)  5.7-6.4%   Increased risk of developing Diabetes Mellitus  References:Diagnosis and Classification of Diabetes  [place],Diabetes [id number] 1):S62-S69 and Standards of Medical Care in         Diabetes - 2011,Diabetes [id number]  (Suppl 1):S11-S61.   Lipid Panel: No results for input(s): CHOL, HDL, LDLCALC, TRIG, CHOLHDL, LDLDIRECT in the last 8760 hours.     Assessment/Plan  FTT (failure to thrive) in adult Weight trending down, continues to have a poor appetite and refuses to get OOB.  Continue with comfort care.  Family does not want Hospice.  Other pancytopenia ([place]) No further bleeding  episodes. No labs due to goals of care.  Alzheimer's disease Late stage. No longer ambulatory but able to communicate and intermittently follow commands. No behavior issuses. No longer on meds.   Dysphagia, pharyngoesophageal phase Continue D2 diet with NTL. Aspiration prec.  Aortic stenosis No chest pain or sob. Requires oxygen at 2L. No medical intervention. BP ok.        Cindi Carbon, ANP [facility] [phone]

## 2015-11-03 NOTE — Assessment & Plan Note (Signed)
Late stage. No longer ambulatory but able to communicate and intermittently follow commands. No behavior issuses. No longer on meds.

## 2015-11-03 NOTE — Assessment & Plan Note (Signed)
Continue D2 diet with NTL. Aspiration prec.

## 2015-12-02 ENCOUNTER — Encounter: Payer: Self-pay | Admitting: Adult Health

## 2015-12-02 ENCOUNTER — Non-Acute Institutional Stay: Payer: Medicare Other | Admitting: Adult Health

## 2015-12-02 DIAGNOSIS — G301 Alzheimer's disease with late onset: Secondary | ICD-10-CM | POA: Diagnosis not present

## 2015-12-02 DIAGNOSIS — I5030 Unspecified diastolic (congestive) heart failure: Secondary | ICD-10-CM

## 2015-12-02 DIAGNOSIS — R627 Adult failure to thrive: Secondary | ICD-10-CM | POA: Diagnosis not present

## 2015-12-02 DIAGNOSIS — F0280 Dementia in other diseases classified elsewhere without behavioral disturbance: Secondary | ICD-10-CM

## 2015-12-02 DIAGNOSIS — K5901 Slow transit constipation: Secondary | ICD-10-CM

## 2015-12-02 DIAGNOSIS — R1314 Dysphagia, pharyngoesophageal phase: Secondary | ICD-10-CM | POA: Diagnosis not present

## 2015-12-02 DIAGNOSIS — R682 Dry mouth, unspecified: Secondary | ICD-10-CM

## 2015-12-02 DIAGNOSIS — K117 Disturbances of salivary secretion: Secondary | ICD-10-CM | POA: Diagnosis not present

## 2015-12-02 NOTE — Progress Notes (Signed)
Patient ID: TYNIAH HONTS, female   DOB: Jun 11, 1922, 82 y.o.   MRN: [id number]    Nursing Home Location:  [place]   Code Status: DNR, comfort care  Patient Care Team: Gayland Curry, DO as PCP - General (Geriatric Medicine) Well [place], NP as Nurse Practitioner (Nurse Practitioner)  Goals of care: Advanced Directive information Advanced Directives 09/27/2015  Does patient have an advance directive? Yes  Type of Paramedic of [place];Living will;Out of facility DNR (pink MOST or yellow form)  Copy of advanced directive(s) in chart? -  Pre-existing out of facility DNR order (yellow form or pink MOST form) Yellow form placed in chart (order not valid for inpatient use);Pink MOST form placed in chart (order not valid for inpatient use)     Place of Service: SNF (31)  Chief Complaint  Patient presents with  . Medical Management of Chronic Issues    HPI:  82 y.o. female residing at [facility], skilled care section. I am here to review her chronic medical issues. She has a hx of advanced dementia, chf, dysphagia, hematuria, AS, and pancytopenia.  Her goals of care are comfort, she is a DNR She continues to have periods of weakness where she does not want to get out of bed and has a poor appetites. Followed by periods of alertness. Her weight continues to trend downward to 155 lbs. She intermittently requires oxygen for hypoxia and mild sob. Duonebs seem to help.   Review of Systems:  Review of Systems  Constitutional: Positive for activity change, appetite change and unexpected weight change. Negative for fever.  HENT: Negative for facial swelling and mouth sores.   Eyes: Negative for discharge and redness.  Respiratory: Positive for shortness of breath. Negative for cough and choking.   Cardiovascular: Positive for leg swelling. Negative for chest pain.  Neurological: Negative  for speech difficulty.  Psychiatric/Behavioral: Negative for dysphoric mood and agitation. The patient is not nervous/anxious.   All other systems reviewed and are negative.   Medications: Patient's Medications  New Prescriptions   No medications on file  Previous Medications   ANTISEPTIC ORAL RINSE (BIOTENE) LIQD    15 mLs by Mouth Rinse route 3 (three) times daily.   BRIMONIDINE-TIMOLOL (COMBIGAN) 0.2-0.5 % OPHTHALMIC SOLUTION    Place 1 drop into the left eye 2 (two) times daily.   FUROSEMIDE (LASIX) 40 MG TABLET    Take 40 mg by mouth daily.    IPRATROPIUM-ALBUTEROL (DUONEB) 0.5-2.5 (3) MG/3ML SOLN    Take 3 mLs by nebulization every 6 (six) hours as needed.   MORPHINE (ROXANOL) 20 MG/ML CONCENTRATED SOLUTION    Take 5 mg by mouth every 2 (two) hours as needed for severe pain.   NEPAFENAC (NEVANAC) 0.1 % OPHTHALMIC SUSPENSION    Place 1 drop into the left eye 3 (three) times daily.    POLYETHYLENE GLYCOL (MIRALAX / GLYCOLAX) PACKET    Take 17 g by mouth. 17gram in beverage of choice every other morning for constipation   POTASSIUM CHLORIDE (MICRO-K) 10 MEQ CR CAPSULE    Take 20 mEq by mouth daily.    SENNA (SENOKOT) 8.6 MG TABS    Take 1 tablet by mouth at bedtime.  Modified Medications   No medications on file  Discontinued Medications   No medications on file     Physical Exam:  Filed Vitals:   12/02/15 1211  Weight: 155 lb 9.6 oz (70.58 kg)  Wt Readings from Last 3 Encounters:  12/02/15 155 lb 9.6 oz (70.58 kg)  11/03/15 161 lb 12.8 oz (73.392 kg)  09/27/15 163 lb (73.936 kg)     Physical Exam  Constitutional: No distress.  HENT:  Head: Normocephalic and atraumatic.  Mouth/Throat: No oropharyngeal exudate.  Dry mouth  Eyes: Pupils are equal, round, and reactive to light.  Neck: Normal range of motion. Neck supple.  Cardiovascular: Intact distal pulses.   Murmur heard. Pulmonary/Chest: Effort normal. No respiratory distress. She has no wheezes. She has no  rales.  decreased  Abdominal: Soft. Bowel sounds are normal. She exhibits no distension. There is no tenderness.  Musculoskeletal: Normal range of motion. She exhibits edema.  +2 edema to bilat lower extremities  Neurological: She is alert.  confused  Skin: Skin is warm and dry. She is not diaphoretic.  Psychiatric: Mood and affect normal.  Nursing note and vitals reviewed.   Wt Readings from Last 3 Encounters:  12/02/15 155 lb 9.6 oz (70.58 kg)  11/03/15 161 lb 12.8 oz (73.392 kg)  09/27/15 163 lb (73.936 kg)     Labs reviewed/Significant Diagnostic Results:  Basic Metabolic Panel:  Recent Labs  01/11/15  NA 143  K 3.9  BUN 29*  CREATININE 0.9   Liver Function Tests:  Recent Labs  01/11/15  AST 11*  ALT 8  ALKPHOS 70   No results for input(s): LIPASE, AMYLASE in the last 8760 hours. No results for input(s): AMMONIA in the last 8760 hours. CBC:  Recent Labs  12/09/14  WBC 2.0  HGB 9.1*  HCT 27*  PLT 56*   CBG: No results for input(s): GLUCAP in the last 8760 hours. TSH: No results for input(s): TSH in the last 8760 hours. A1C: Lab Results  Component Value Date   HGBA1C * 11/02/2009    6.5 (NOTE)                                                                       According to the ADA Clinical Practice Recommendations for 2011, when HbA1c is used as a screening test:   >=6.5%   Diagnostic of Diabetes Mellitus           (if abnormal result  is confirmed)  5.7-6.4%   Increased risk of developing Diabetes Mellitus  References:Diagnosis and Classification of Diabetes Mellitus,Diabetes [id number] 1):S62-S69 and Standards of Medical Care in         Diabetes - 2011,Diabetes [id number]  (Suppl 1):S11-S61.   Lipid Panel: No results for input(s): CHOL, HDL, LDLCALC, TRIG, CHOLHDL, LDLDIRECT in the last 8760 hours.     Assessment/Plan  1. FTT (failure to thrive) in adult -continues to lose weight -family has declined hospice -goals of care  continue to be comfort based with no RX including no antibiotics or hospitalizations  2. Congestive heart failure with LV diastolic dysfunction, NYHA class 1 (HCC) -stable with weight trending downward -may need 02 at times due to this with AS -continue lasix  3. Dysphagia, pharyngoesophageal phase -stable, continue modified diet and asp prec  4. Slow transit constipation -controlled -continue miralax and senokot  5. Late onset Alzheimer's disease without behavioral disturbance -advanced, continue supportive measures -not on meds due to lack of benefit  6. Xerostomia -continue biotene   Cindi Carbon, ANP [facility] [phone]

## 2015-12-27 ENCOUNTER — Other Ambulatory Visit: Payer: Self-pay

## 2015-12-27 MED ORDER — MORPHINE SULFATE (CONCENTRATE) 20 MG/ML PO SOLN: ORAL | Status: AC

## 2015-12-27 NOTE — Telephone Encounter (Signed)
Faxed to [facility] Fax Number: [phone], Phone Number [phone]  

## 2016-01-10 ENCOUNTER — Non-Acute Institutional Stay: Payer: Medicare Other | Admitting: Internal Medicine

## 2016-01-10 DIAGNOSIS — F0280 Dementia in other diseases classified elsewhere without behavioral disturbance: Secondary | ICD-10-CM

## 2016-01-10 DIAGNOSIS — R627 Adult failure to thrive: Secondary | ICD-10-CM | POA: Diagnosis not present

## 2016-01-10 DIAGNOSIS — G301 Alzheimer's disease with late onset: Secondary | ICD-10-CM

## 2016-01-10 DIAGNOSIS — I5030 Unspecified diastolic (congestive) heart failure: Secondary | ICD-10-CM | POA: Diagnosis not present

## 2016-01-10 DIAGNOSIS — D61818 Other pancytopenia: Secondary | ICD-10-CM

## 2016-01-10 NOTE — Progress Notes (Signed)
Patient ID: Nicole Roach, female   DOB: [birth year], 82 y.o.   MRN: [id number]  Location:   [place] Room Number: 126 Place of Service:  SNF (31) Provider:  Gabrella Stroh L. Mariea Clonts, D.O., C.M.D. Hollace Kinnier, DO  Patient Care Team: Gayland Curry, DO as PCP - General (Geriatric Medicine) Well [place], NP as Nurse Practitioner (Nurse Practitioner)  Extended Emergency Contact Information Primary Emergency Contact: Clement,Starla Address:              [address] of [place] Phone: [phone] Mobile Phone: [phone] Relation: Relative  Code Status:  DNR, MOST Goals of care: Advanced Directive information Advanced Directives 01/10/2016  Does patient have an advance directive? Yes  Type of Advance Directive Out of facility DNR (pink MOST or yellow form);[place];Living will  Copy of advanced directive(s) in chart? Yes  Pre-existing out of facility DNR order (yellow form or pink MOST form) Yellow form placed in chart (order not valid for inpatient use);Pink MOST form placed in chart (order not valid for inpatient use)   Chief Complaint  Patient presents with  . Medical Management of Chronic Issues    routine visit    HPI:  Pt is a 82 y.o. female seen today for medical management of chronic diseases.  She is lethargic, resting in bed.  She tries to speak but it is garbled and cannot be understood.  She is lying with her head tilted back and a glazed over look.  It took a long time for her to respond.  Her skin tone is gray and she appears chronically ill.  Review of prior labs indicates pancytopenia which family decided not to work up with her dementia (MMSE 11/30 in 2015).  She is due for prevnar if family wants her to have it at this stage.     Past Medical History  Diagnosis Date  . Alzheimer's disease 12/01/2012  . Osteoarthrosis, unspecified whether generalized or localized, unspecified site 1999  .  Unspecified essential hypertension 2000  . Macular degeneration (senile) of retina, unspecified 2005    Left eye, status Roach posterior vitrectomy  . Atrial fibrillation ([place]) 2008    warfarin DC 2011 due to to anemia  . Unspecified vitamin D deficiency 2009  . Vitreous hemorrhage ([place]) 2011  . Depressive disorder, not elsewhere classified 2011  . Anemia, unspecified 2011  . Thrombocytopenia, unspecified ([place]) 2011  . Pneumonia, organism unspecified 2011  . Debility, unspecified 2011  . Diastolic heart failure ([place]) 2011    2D echocardiogram w/ wevidence of diastolic dysfunction  . Primary pulmonary hypertension ([place]) 2011  . Lipoma of other skin and subcutaneous tissue 2011    large lipoma rt. mid back  . Leukocytopenia, unspecified 2011  . Impacted cerumen 2011  . Conjunctivitis unspecified 2011  . Ganglion and cyst of synovium, tendon and bursa 2012  . Other B-complex deficiencies 2012  . Unspecified constipation 2013  . Loss of weight 05/2012  . Anemia 12/13/2009    Qualifier: Diagnosis of  By: Lovette Cliche, CNA, Christy  12/13/09: Reviewed lab results with Starla,(POA), anemia worse, no sign of active bleeding , but energy level level remains poor. Likely is slow GI bleed. Reviewed goals of care for Nicole Roach. Aggressive workup and treatment of GI lesion is not in the plan. Decision made to d/c Coumadin, recheck CBC. If anemia continues to worsen (Hgb <8.0) will consider GI consult/or transfusion  2012: B12 supplementation due to MCV elevation,  borderline low B12 level . H/H  improved since Rem[place] w/with leukopenia, thrombocytopenia. No sign of active bleeding.  2013: 12/14/11: CBC with lower H/H- platelets improved. 05/27/12: H/H  and platelets stable, no sign of active bleeding.  08/19/12: ASymptomatic, last CBC stable. Recheck 10/2012      Past Surgical History  Procedure Laterality Date  . Tonsillectomy and adenoidectomy  1927  . Thyroidectomy, partial  1950s  . Total hip arthroplasty  Right 1999  . Cataract extraction w/ intraocular lens  implant, bilateral Bilateral 2002  . Vitrectomy Left 2005    Posterior vitrectomy and repair  . Vitrectomy Left 2/ 2011    Allergies  Allergen Reactions  . Avelox [Moxifloxacin Hcl In Nacl]   . Levaquin [Levofloxacin In D5w]   . Quinolones   . Tetanus Toxoids       Medication List       This list is accurate as of: 01/10/16 11:59 PM.  Always use your most recent med list.               antiseptic oral rinse Liqd  15 mLs by Mouth Rinse route 3 (three) times daily.     brimonidine-timolol 0.2-0.5 % ophthalmic solution  Commonly known as:  COMBIGAN  Place 1 drop into the left eye 2 (two) times daily.     furosemide 40 MG tablet  Commonly known as:  LASIX  Take 40 mg by mouth daily.     ipratropium-albuterol 0.5-2.5 (3) MG/3ML Soln  Commonly known as:  DUONEB  Take 3 mLs by nebulization every 6 (six) hours as needed.     morphine 20 MG/ML concentrated solution  Commonly known as:  ROXANOL  Take 0.25 mL by mouth every 2 hours as needed for SOB or pain     nepafenac 0.1 % ophthalmic suspension  Commonly known as:  NEVANAC  Place 1 drop into the left eye 3 (three) times daily.     polyethylene glycol packet  Commonly known as:  MIRALAX / GLYCOLAX  Take 17 g by mouth. 17gram in beverage of choice every other morning for constipation     potassium chloride 10 MEQ CR capsule  Commonly known as:  MICRO-K  Take 20 mEq by mouth daily.     senna 8.6 MG Tabs tablet  Commonly known as:  SENOKOT  Take 1 tablet by mouth at bedtime.        Review of Systems  Unable to perform ROS: Dementia (remainder from nursing in hpi)  Constitutional:       Continues to decline gradually    Immunization History  Administered Date(s) Administered  . Influenza Whole 05/06/2013  . Influenza-Unspecified 04/27/2014, 05/05/2015  . PPD Test 11/29/2011  . Pneumococcal Polysaccharide-23 08/14/2011   Pertinent  Health Maintenance  Due  Topic Date Due  . DEXA SCAN  12/29/1988  . PNA vac Low Risk Adult (2 of 2 - PCV13) 08/13/2012  . INFLUENZA VACCINE  02/21/2016   Fall Risk  02/10/2015 01/10/2015  Falls in the past year? No No  Risk for fall due to : History of fall(s) History of fall(s)   Filed Vitals:   01/27/16 1616  BP: 139/57  Pulse: 72  Temp: 97.6 F (36.4 C)  Resp: 16  Weight: 151 lb 3.2 oz (68.584 kg)  SpO2: 86%   Body mass index is 23.68 kg/(m^2). Physical Exam  Constitutional: No distress.  Pale, chronically ill appearing white female resting in bed  Cardiovascular: Normal rate and regular rhythm.  Pulmonary/Chest: Effort normal and breath sounds normal. No respiratory distress.  Abdominal: Soft. Bowel sounds are normal. She exhibits no distension and no mass. There is no tenderness. There is no rebound and no guarding.  Neurological:  Speech unintelligible  Skin: Skin is warm and dry. There is pallor.  Psychiatric:  Glazed over appearance    Labs reviewed: No results for input(s): NA, K, CL, CO2, GLUCOSE, BUN, CREATININE, CALCIUM, MG, PHOS in the last 8760 hours. No results for input(s): AST, ALT, ALKPHOS, BILITOT, PROT, ALBUMIN in the last 8760 hours. No results for input(s): WBC, NEUTROABS, HGB, HCT, MCV, PLT in the last 8760 hours. Lab Results  Component Value Date   TSH 1.88 08/25/2013   Lab Results  Component Value Date   HGBA1C * 11/02/2009    6.5 (NOTE)                                                                       According to the ADA Clinical Practice Recommendations for 2011, when HbA1c is used as a screening test:   >=6.5%   Diagnostic of Diabetes Mellitus           (if abnormal result  is confirmed)  5.7-6.4%   Increased risk of developing Diabetes Mellitus  References:Diagnosis and Classification of Diabetes Mellitus,Diabetes [id number] 1):S62-S69 and Standards of Medical Care in         Diabetes - 2011,Diabetes [id number]  (Suppl 1):S11-S61.   Lab Results   Component Value Date   CHOL  11/03/2009    131        ATP III CLASSIFICATION:  <200     mg/dL   Desirable  200-239  mg/dL   Borderline High  >=240    mg/dL   High          HDL 45 11/03/2009   LDLCALC  11/03/2009    76        Total Cholesterol/HDL:CHD Risk Coronary Heart Disease Risk Table                     Men   Women  1/2 Average Risk   3.4   3.3  Average Risk       5.0   4.4  2 X Average Risk   9.6   7.1  3 X Average Risk  23.4   11.0        Use the calculated Patient Ratio above and the CHD Risk Table to determine the patient's CHD Risk.        ATP III CLASSIFICATION (LDL):  <100     mg/dL   Optimal  100-129  mg/dL   Near or Above                    Optimal  130-159  mg/dL   Borderline  160-189  mg/dL   High  >190     mg/dL   Very High   TRIG 49 11/03/2009   CHOLHDL 2.9 11/03/2009    Assessment/Plan 1. Late onset Alzheimer's disease without behavioral disturbance -late stages, failing to thrive, losing weight gradually, hypoxic frequently and pancytopenic -has MOST for comfort measures -if weight truly does trend down, would qualify for hospice  2.  Other pancytopenia [facility]) -family opted for no workup which is appropriate considering she would not survive treatment and bone marrow bx would be painful  3. Congestive heart failure with LV diastolic dysfunction, NYHA class 1 ([place]) -continues with periods of hypoxia, but no overt volume overload evident today  4. FTT (failure to thrive) in adult -ongoing, end stage dementia, cont comfort measures  Family/ staff Communication: discussed with SNF staff  Labs/tests ordered:  No labs due to comfort care on MOST (last labs 01/11/15)

## 2016-01-27 ENCOUNTER — Encounter: Payer: Self-pay | Admitting: Internal Medicine

## 2016-02-06 ENCOUNTER — Non-Acute Institutional Stay (SKILLED_NURSING_FACILITY): Payer: Medicare Other | Admitting: Adult Health

## 2016-02-06 DIAGNOSIS — R682 Dry mouth, unspecified: Principal | ICD-10-CM

## 2016-02-06 DIAGNOSIS — R627 Adult failure to thrive: Secondary | ICD-10-CM | POA: Diagnosis not present

## 2016-02-06 DIAGNOSIS — R1314 Dysphagia, pharyngoesophageal phase: Secondary | ICD-10-CM | POA: Diagnosis not present

## 2016-02-06 DIAGNOSIS — G301 Alzheimer's disease with late onset: Secondary | ICD-10-CM | POA: Diagnosis not present

## 2016-02-06 DIAGNOSIS — K117 Disturbances of salivary secretion: Secondary | ICD-10-CM

## 2016-02-06 DIAGNOSIS — F0280 Dementia in other diseases classified elsewhere without behavioral disturbance: Secondary | ICD-10-CM

## 2016-02-06 DIAGNOSIS — I350 Nonrheumatic aortic (valve) stenosis: Secondary | ICD-10-CM | POA: Diagnosis not present

## 2016-02-06 DIAGNOSIS — I5030 Unspecified diastolic (congestive) heart failure: Secondary | ICD-10-CM

## 2016-02-06 NOTE — Progress Notes (Signed)
Patient ID: Nicole Roach, female   DOB: Feb 22, 1922, 82 y.o.   MRN: [id number]    [facility]ursing Home [facility]  Code Status: DNR, comfort care  Patient Care Team: Gayland Curry, DO as PCP - General (Geriatric Medicine) Well [place], NP as Nurse Practitioner (Nurse Practitioner)  Goals of care: Advanced Directive information Advanced Directives 01/10/2016  Does patient have an advance directive? Yes  Type of Advance Directive Out of facility DNR (pink MOST or yellow form);[place];Living will  Copy of advanced directive(s) in chart? Yes  Pre-existing out of facility DNR order (yellow form or pink MOST form) Yellow form placed in chart (order not valid for inpatient use);Pink MOST form placed in chart (order not valid for inpatient use)     Place of Service: SNF (31)  Chief Complaint  Patient presents with  . Medical Management of Chronic Issues    HPI:  82 y.o. female residing at [facility], skilled care section. I am here to review her chronic medical issues. She has a hx of advanced dementia, chf, dysphagia, hematuria, AS, and pancytopenia.  Her goals of care are comfort, she is a DNR Staff has no complaints regarding her care at this time.   1. Xerostomia Currently on biotene spray but continues to report dry mouth  2. Dysphagia, pharyngoesophageal phase Puree diet with NTL tolerating well but poor intake  3. Late onset Alzheimer's disease without behavioral disturbance Advanced  Non ambulatory, not on meds Remains verbal and plesant  4. Congestive heart failure with LV diastolic dysfunction, NYHA class 1 ([place]) EF 55% in 2011 with mild to moderate pulmonary htn Currently on lasix 40 mg qd Has not had sob or cp No prn roxanol given Weight trending down  5. Aortic stenosis Notable murmur, peak gradient with 22 mmHg in 2011  6. FTT Significant weight loss this month to 139 lbs, 12  lb loss since June ?? Accuracy Overall poor p.o. Intake, remains in bed much of the time Goals of care are comfort based, no hosipitalizations   Review of Systems:  Review of Systems  Constitutional: Positive for appetite change and unexpected weight change. Negative for activity change and fever.  HENT: Negative for facial swelling and mouth sores.   Eyes: Negative for discharge and redness.  Respiratory: Negative for cough, choking and shortness of breath.   Cardiovascular: Positive for leg swelling. Negative for chest pain.  Neurological: Negative for speech difficulty.  Psychiatric/Behavioral: Negative for dysphoric mood and agitation. The patient is not nervous/anxious.   All other systems reviewed and are negative.   Medications: Patient's Medications  New Prescriptions   No medications on file  Previous Medications   ANTISEPTIC ORAL RINSE (BIOTENE) LIQD    15 mLs by Mouth Rinse route 3 (three) times daily.   BRIMONIDINE-TIMOLOL (COMBIGAN) 0.2-0.5 % OPHTHALMIC SOLUTION    Place 1 drop into the left eye 2 (two) times daily.   FUROSEMIDE (LASIX) 40 MG TABLET    Take 40 mg by mouth daily.    IPRATROPIUM-ALBUTEROL (DUONEB) 0.5-2.5 (3) MG/3ML SOLN    Take 3 mLs by nebulization every 6 (six) hours as needed.   MORPHINE (ROXANOL) 20 MG/ML CONCENTRATED SOLUTION    Take 0.25 mL by mouth every 2 hours as needed for SOB or pain   NEPAFENAC (NEVANAC) 0.1 % OPHTHALMIC SUSPENSION    Place 1 drop into the left eye 3 (three) times daily.    POLYETHYLENE  GLYCOL (MIRALAX / GLYCOLAX) PACKET    Take 17 g by mouth. 17gram in beverage of choice every other morning for constipation   POTASSIUM CHLORIDE (MICRO-K) 10 MEQ CR CAPSULE    Take 20 mEq by mouth daily.    SENNA (SENOKOT) 8.6 MG TABS    Take 1 tablet by mouth at bedtime.  Modified Medications   No medications on file  Discontinued Medications   No medications on file     Physical Exam:  There were no vitals filed for this visit. Wt  Readings from Last 3 Encounters:  02/14/16 139 lb 12.8 oz (63.4 kg)  01/27/16 151 lb 3.2 oz (68.6 kg)  12/02/15 155 lb 9.6 oz (70.6 kg)     Physical Exam  Constitutional: No distress.  HENT:  Head: Normocephalic and atraumatic.  Mouth/Throat: No oropharyngeal exudate.  Dry mouth  Eyes: Pupils are equal, round, and reactive to light.  Neck: Normal range of motion. Neck supple.  Cardiovascular: Intact distal pulses.   Murmur heard. Pulmonary/Chest: Effort normal. No respiratory distress. She has no wheezes. She has no rales.  decreased  Abdominal: Soft. Bowel sounds are normal. She exhibits no distension. There is no tenderness.  Musculoskeletal: Normal range of motion.  +1 edema to bilat lower extremities  Neurological: She is alert.  confused  Skin: Skin is warm and dry. She is not diaphoretic.  Psychiatric: Mood and affect normal.  Nursing note and vitals reviewed.     Labs reviewed/Significant Diagnostic Results:  Basic Metabolic Panel: No results for input(s): NA, K, CL, CO2, GLUCOSE, BUN, CREATININE, CALCIUM, MG, PHOS in the last 8760 hours. Liver Function Tests: No results for input(s): AST, ALT, ALKPHOS, BILITOT, PROT, ALBUMIN in the last 8760 hours. No results for input(s): LIPASE, AMYLASE in the last 8760 hours. No results for input(s): AMMONIA in the last 8760 hours. CBC: No results for input(s): WBC, NEUTROABS, HGB, HCT, MCV, PLT in the last 8760 hours. CBG: No results for input(s): GLUCAP in the last 8760 hours. TSH: No results for input(s): TSH in the last 8760 hours. A1C: Lab Results  Component Value Date   HGBA1C * 11/02/2009    6.5 (NOTE)                                                                       According to the ADA Clinical Practice Recommendations for 2011, when HbA1c is used as a screening test:   >=6.5%   Diagnostic of Diabetes Mellitus           (if abnormal result  is confirmed)  5.7-6.4%   Increased risk of developing Diabetes  [place]  References:Diagnosis and Classification of Diabetes Mellitus,Diabetes [id number] 1):S62-S69 and Standards of Medical Care in         Diabetes - 2011,Diabetes [id number]  (Suppl 1):S11-S61.   Lipid Panel: No results for input(s): CHOL, HDL, LDLCALC, TRIG, CHOLHDL, LDLDIRECT in the last 8760 hours.     Assessment/Plan 1. Xerostomia Continue biotene TID, add mouth moisturizer TID  2. Dysphagia, pharyngoesophageal phase No episodes of choking, continue modified diet  3. Late onset Alzheimer's disease without behavioral disturbance Advanced No longer on meds due to lack of benefit Goals of care are comfort based  4. Congestive  heart failure with LV diastolic dysfunction, NYHA class 1 (HCC) Decrease lasix to 20 mg qd and decrease Kdur to 10 meq qd, report increased WOB as she has a very dry mouth and complains of this a lot with poor p.o. intake  5. Aortic stenosis Notable murmur on exam probably worsened since echo in 2011, denies sob or chest pain No additional work up due to goals of care Maintain BP and volume status to help prevent symptoms  6. FTT No appetite stimulant Comfort care Gradual decline at the end of life     Cindi Carbon, [place] [phone]

## 2016-02-14 ENCOUNTER — Encounter: Payer: Self-pay | Admitting: Adult Health

## 2016-02-21 DEATH — deceased
